# Patient Record
Sex: Male | Born: 1970 | Race: White | Hispanic: No | Marital: Married | Smoking: Former smoker
Health system: Southern US, Community
[De-identification: ages and names within clinical notes are randomized; demographics above are authoritative.]

## PROBLEM LIST (undated history)

## (undated) DIAGNOSIS — F32A Depression, unspecified: Secondary | ICD-10-CM

## (undated) DIAGNOSIS — I10 Essential (primary) hypertension: Secondary | ICD-10-CM

## (undated) DIAGNOSIS — F329 Major depressive disorder, single episode, unspecified: Secondary | ICD-10-CM

## (undated) DIAGNOSIS — F419 Anxiety disorder, unspecified: Secondary | ICD-10-CM

## (undated) DIAGNOSIS — M1990 Unspecified osteoarthritis, unspecified site: Secondary | ICD-10-CM

## (undated) DIAGNOSIS — E669 Obesity, unspecified: Secondary | ICD-10-CM

## (undated) DIAGNOSIS — N200 Calculus of kidney: Secondary | ICD-10-CM

## (undated) DIAGNOSIS — E7800 Pure hypercholesterolemia, unspecified: Secondary | ICD-10-CM

## (undated) DIAGNOSIS — R7989 Other specified abnormal findings of blood chemistry: Secondary | ICD-10-CM

## (undated) DIAGNOSIS — J45909 Unspecified asthma, uncomplicated: Secondary | ICD-10-CM

## (undated) DIAGNOSIS — Z6834 Body mass index (BMI) 34.0-34.9, adult: Secondary | ICD-10-CM

## (undated) DIAGNOSIS — G4733 Obstructive sleep apnea (adult) (pediatric): Secondary | ICD-10-CM

## (undated) DIAGNOSIS — G4730 Sleep apnea, unspecified: Secondary | ICD-10-CM

## (undated) HISTORY — PX: CHOLECYSTECTOMY: SHX55

## (undated) HISTORY — DX: Calculus of kidney: N20.0

## (undated) HISTORY — DX: Depression, unspecified: F32.A

## (undated) HISTORY — PX: TONSILLECTOMY: SUR1361

## (undated) HISTORY — DX: Unspecified asthma, uncomplicated: J45.909

## (undated) HISTORY — DX: Body mass index (BMI) 34.0-34.9, adult: Z68.34

## (undated) HISTORY — DX: Essential (primary) hypertension: I10

## (undated) HISTORY — DX: Obstructive sleep apnea (adult) (pediatric): G47.33

## (undated) HISTORY — DX: Pure hypercholesterolemia, unspecified: E78.00

## (undated) HISTORY — DX: Unspecified osteoarthritis, unspecified site: M19.90

## (undated) HISTORY — DX: Obesity, unspecified: E66.9

## (undated) HISTORY — DX: Sleep apnea, unspecified: G47.30

## (undated) HISTORY — DX: Anxiety disorder, unspecified: F41.9

## (undated) HISTORY — DX: Other specified abnormal findings of blood chemistry: R79.89

---
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
## 1999-08-22 ENCOUNTER — Emergency Department (HOSPITAL_COMMUNITY): Admission: EM | Admit: 1999-08-22 | Discharge: 1999-08-22 | Payer: Self-pay | Admitting: Emergency Medicine

## 2000-09-22 ENCOUNTER — Emergency Department (HOSPITAL_COMMUNITY): Admission: EM | Admit: 2000-09-22 | Discharge: 2000-09-22 | Payer: Self-pay | Admitting: Emergency Medicine

## 2007-01-17 HISTORY — PX: COLONOSCOPY: SHX174

## 2011-10-27 DIAGNOSIS — F312 Bipolar disorder, current episode manic severe with psychotic features: Secondary | ICD-10-CM | POA: Diagnosis not present

## 2011-11-22 DIAGNOSIS — F312 Bipolar disorder, current episode manic severe with psychotic features: Secondary | ICD-10-CM | POA: Diagnosis not present

## 2011-12-06 DIAGNOSIS — F39 Unspecified mood [affective] disorder: Secondary | ICD-10-CM | POA: Diagnosis not present

## 2011-12-06 DIAGNOSIS — F489 Nonpsychotic mental disorder, unspecified: Secondary | ICD-10-CM | POA: Diagnosis not present

## 2011-12-06 DIAGNOSIS — F411 Generalized anxiety disorder: Secondary | ICD-10-CM | POA: Diagnosis not present

## 2011-12-06 DIAGNOSIS — F429 Obsessive-compulsive disorder, unspecified: Secondary | ICD-10-CM | POA: Diagnosis not present

## 2012-01-29 DIAGNOSIS — F312 Bipolar disorder, current episode manic severe with psychotic features: Secondary | ICD-10-CM | POA: Diagnosis not present

## 2012-02-07 DIAGNOSIS — E559 Vitamin D deficiency, unspecified: Secondary | ICD-10-CM | POA: Diagnosis not present

## 2012-02-07 DIAGNOSIS — J45909 Unspecified asthma, uncomplicated: Secondary | ICD-10-CM | POA: Diagnosis not present

## 2012-02-07 DIAGNOSIS — F411 Generalized anxiety disorder: Secondary | ICD-10-CM | POA: Diagnosis not present

## 2012-02-07 DIAGNOSIS — J310 Chronic rhinitis: Secondary | ICD-10-CM | POA: Diagnosis not present

## 2012-02-07 DIAGNOSIS — G4730 Sleep apnea, unspecified: Secondary | ICD-10-CM | POA: Diagnosis not present

## 2012-02-14 DIAGNOSIS — G4710 Hypersomnia, unspecified: Secondary | ICD-10-CM | POA: Diagnosis not present

## 2012-02-14 DIAGNOSIS — G4730 Sleep apnea, unspecified: Secondary | ICD-10-CM | POA: Diagnosis not present

## 2012-02-27 DIAGNOSIS — G4730 Sleep apnea, unspecified: Secondary | ICD-10-CM | POA: Diagnosis not present

## 2012-02-27 DIAGNOSIS — R5383 Other fatigue: Secondary | ICD-10-CM | POA: Diagnosis not present

## 2012-02-27 DIAGNOSIS — G4710 Hypersomnia, unspecified: Secondary | ICD-10-CM | POA: Diagnosis not present

## 2012-02-27 DIAGNOSIS — J45909 Unspecified asthma, uncomplicated: Secondary | ICD-10-CM | POA: Diagnosis not present

## 2012-02-27 DIAGNOSIS — R5381 Other malaise: Secondary | ICD-10-CM | POA: Diagnosis not present

## 2012-02-27 DIAGNOSIS — J310 Chronic rhinitis: Secondary | ICD-10-CM | POA: Diagnosis not present

## 2012-03-12 DIAGNOSIS — G4710 Hypersomnia, unspecified: Secondary | ICD-10-CM | POA: Diagnosis not present

## 2012-03-12 DIAGNOSIS — G4730 Sleep apnea, unspecified: Secondary | ICD-10-CM | POA: Diagnosis not present

## 2012-03-18 DIAGNOSIS — F4010 Social phobia, unspecified: Secondary | ICD-10-CM | POA: Diagnosis not present

## 2012-03-18 DIAGNOSIS — G4710 Hypersomnia, unspecified: Secondary | ICD-10-CM | POA: Diagnosis not present

## 2012-03-18 DIAGNOSIS — F411 Generalized anxiety disorder: Secondary | ICD-10-CM | POA: Diagnosis not present

## 2012-03-18 DIAGNOSIS — J45909 Unspecified asthma, uncomplicated: Secondary | ICD-10-CM | POA: Diagnosis not present

## 2012-03-18 DIAGNOSIS — J310 Chronic rhinitis: Secondary | ICD-10-CM | POA: Diagnosis not present

## 2012-03-18 DIAGNOSIS — R5381 Other malaise: Secondary | ICD-10-CM | POA: Diagnosis not present

## 2012-03-18 DIAGNOSIS — R5383 Other fatigue: Secondary | ICD-10-CM | POA: Diagnosis not present

## 2012-03-18 DIAGNOSIS — G4730 Sleep apnea, unspecified: Secondary | ICD-10-CM | POA: Diagnosis not present

## 2012-04-03 DIAGNOSIS — F429 Obsessive-compulsive disorder, unspecified: Secondary | ICD-10-CM | POA: Diagnosis not present

## 2012-04-03 DIAGNOSIS — F4001 Agoraphobia with panic disorder: Secondary | ICD-10-CM | POA: Diagnosis not present

## 2012-04-03 DIAGNOSIS — F988 Other specified behavioral and emotional disorders with onset usually occurring in childhood and adolescence: Secondary | ICD-10-CM | POA: Diagnosis not present

## 2012-04-09 DIAGNOSIS — F312 Bipolar disorder, current episode manic severe with psychotic features: Secondary | ICD-10-CM | POA: Diagnosis not present

## 2012-04-15 DIAGNOSIS — Z125 Encounter for screening for malignant neoplasm of prostate: Secondary | ICD-10-CM | POA: Diagnosis not present

## 2012-04-15 DIAGNOSIS — I10 Essential (primary) hypertension: Secondary | ICD-10-CM | POA: Diagnosis not present

## 2012-04-15 DIAGNOSIS — E119 Type 2 diabetes mellitus without complications: Secondary | ICD-10-CM | POA: Diagnosis not present

## 2012-04-15 DIAGNOSIS — E559 Vitamin D deficiency, unspecified: Secondary | ICD-10-CM | POA: Diagnosis not present

## 2012-04-15 DIAGNOSIS — E782 Mixed hyperlipidemia: Secondary | ICD-10-CM | POA: Diagnosis not present

## 2012-04-23 DIAGNOSIS — S61409A Unspecified open wound of unspecified hand, initial encounter: Secondary | ICD-10-CM | POA: Diagnosis not present

## 2012-05-14 DIAGNOSIS — M545 Low back pain: Secondary | ICD-10-CM | POA: Diagnosis not present

## 2012-05-14 DIAGNOSIS — N400 Enlarged prostate without lower urinary tract symptoms: Secondary | ICD-10-CM | POA: Diagnosis not present

## 2012-05-14 DIAGNOSIS — J449 Chronic obstructive pulmonary disease, unspecified: Secondary | ICD-10-CM | POA: Diagnosis not present

## 2012-05-14 DIAGNOSIS — F411 Generalized anxiety disorder: Secondary | ICD-10-CM | POA: Diagnosis not present

## 2012-05-22 DIAGNOSIS — G4730 Sleep apnea, unspecified: Secondary | ICD-10-CM | POA: Diagnosis not present

## 2012-05-22 DIAGNOSIS — F411 Generalized anxiety disorder: Secondary | ICD-10-CM | POA: Diagnosis not present

## 2012-05-22 DIAGNOSIS — F4010 Social phobia, unspecified: Secondary | ICD-10-CM | POA: Diagnosis not present

## 2012-05-22 DIAGNOSIS — M545 Low back pain: Secondary | ICD-10-CM | POA: Diagnosis not present

## 2012-07-10 DIAGNOSIS — F988 Other specified behavioral and emotional disorders with onset usually occurring in childhood and adolescence: Secondary | ICD-10-CM | POA: Diagnosis not present

## 2012-07-10 DIAGNOSIS — F4001 Agoraphobia with panic disorder: Secondary | ICD-10-CM | POA: Diagnosis not present

## 2012-07-10 DIAGNOSIS — F429 Obsessive-compulsive disorder, unspecified: Secondary | ICD-10-CM | POA: Diagnosis not present

## 2012-07-16 DIAGNOSIS — F312 Bipolar disorder, current episode manic severe with psychotic features: Secondary | ICD-10-CM | POA: Diagnosis not present

## 2012-07-30 DIAGNOSIS — F312 Bipolar disorder, current episode manic severe with psychotic features: Secondary | ICD-10-CM | POA: Diagnosis not present

## 2012-08-13 DIAGNOSIS — Z23 Encounter for immunization: Secondary | ICD-10-CM | POA: Diagnosis not present

## 2012-08-13 DIAGNOSIS — J4489 Other specified chronic obstructive pulmonary disease: Secondary | ICD-10-CM | POA: Diagnosis not present

## 2012-08-13 DIAGNOSIS — G4730 Sleep apnea, unspecified: Secondary | ICD-10-CM | POA: Diagnosis not present

## 2012-08-13 DIAGNOSIS — F17200 Nicotine dependence, unspecified, uncomplicated: Secondary | ICD-10-CM | POA: Diagnosis not present

## 2012-08-13 DIAGNOSIS — J449 Chronic obstructive pulmonary disease, unspecified: Secondary | ICD-10-CM | POA: Diagnosis not present

## 2012-10-02 DIAGNOSIS — F429 Obsessive-compulsive disorder, unspecified: Secondary | ICD-10-CM | POA: Diagnosis not present

## 2012-10-02 DIAGNOSIS — F4001 Agoraphobia with panic disorder: Secondary | ICD-10-CM | POA: Diagnosis not present

## 2012-10-02 DIAGNOSIS — F988 Other specified behavioral and emotional disorders with onset usually occurring in childhood and adolescence: Secondary | ICD-10-CM | POA: Diagnosis not present

## 2012-11-07 DIAGNOSIS — M545 Low back pain: Secondary | ICD-10-CM | POA: Diagnosis not present

## 2012-11-07 DIAGNOSIS — F429 Obsessive-compulsive disorder, unspecified: Secondary | ICD-10-CM | POA: Diagnosis not present

## 2012-11-07 DIAGNOSIS — J309 Allergic rhinitis, unspecified: Secondary | ICD-10-CM | POA: Diagnosis not present

## 2012-11-20 DIAGNOSIS — F312 Bipolar disorder, current episode manic severe with psychotic features: Secondary | ICD-10-CM | POA: Diagnosis not present

## 2012-12-19 DIAGNOSIS — F312 Bipolar disorder, current episode manic severe with psychotic features: Secondary | ICD-10-CM | POA: Diagnosis not present

## 2013-01-01 DIAGNOSIS — F4001 Agoraphobia with panic disorder: Secondary | ICD-10-CM | POA: Diagnosis not present

## 2013-01-01 DIAGNOSIS — F429 Obsessive-compulsive disorder, unspecified: Secondary | ICD-10-CM | POA: Diagnosis not present

## 2013-01-01 DIAGNOSIS — F988 Other specified behavioral and emotional disorders with onset usually occurring in childhood and adolescence: Secondary | ICD-10-CM | POA: Diagnosis not present

## 2013-02-25 DIAGNOSIS — F312 Bipolar disorder, current episode manic severe with psychotic features: Secondary | ICD-10-CM | POA: Diagnosis not present

## 2013-04-21 DIAGNOSIS — F4001 Agoraphobia with panic disorder: Secondary | ICD-10-CM | POA: Diagnosis not present

## 2013-04-21 DIAGNOSIS — F988 Other specified behavioral and emotional disorders with onset usually occurring in childhood and adolescence: Secondary | ICD-10-CM | POA: Diagnosis not present

## 2013-04-21 DIAGNOSIS — F429 Obsessive-compulsive disorder, unspecified: Secondary | ICD-10-CM | POA: Diagnosis not present

## 2013-04-22 DIAGNOSIS — F312 Bipolar disorder, current episode manic severe with psychotic features: Secondary | ICD-10-CM | POA: Diagnosis not present

## 2013-05-29 DIAGNOSIS — F312 Bipolar disorder, current episode manic severe with psychotic features: Secondary | ICD-10-CM | POA: Diagnosis not present

## 2013-06-02 DIAGNOSIS — E559 Vitamin D deficiency, unspecified: Secondary | ICD-10-CM | POA: Diagnosis not present

## 2013-06-02 DIAGNOSIS — I10 Essential (primary) hypertension: Secondary | ICD-10-CM | POA: Diagnosis not present

## 2013-06-02 DIAGNOSIS — E782 Mixed hyperlipidemia: Secondary | ICD-10-CM | POA: Diagnosis not present

## 2013-06-02 DIAGNOSIS — E119 Type 2 diabetes mellitus without complications: Secondary | ICD-10-CM | POA: Diagnosis not present

## 2013-06-02 DIAGNOSIS — F429 Obsessive-compulsive disorder, unspecified: Secondary | ICD-10-CM | POA: Diagnosis not present

## 2013-06-02 DIAGNOSIS — E291 Testicular hypofunction: Secondary | ICD-10-CM | POA: Diagnosis not present

## 2013-06-02 DIAGNOSIS — N400 Enlarged prostate without lower urinary tract symptoms: Secondary | ICD-10-CM | POA: Diagnosis not present

## 2013-06-02 DIAGNOSIS — J449 Chronic obstructive pulmonary disease, unspecified: Secondary | ICD-10-CM | POA: Diagnosis not present

## 2013-06-02 DIAGNOSIS — E039 Hypothyroidism, unspecified: Secondary | ICD-10-CM | POA: Diagnosis not present

## 2013-07-08 DIAGNOSIS — E291 Testicular hypofunction: Secondary | ICD-10-CM | POA: Diagnosis not present

## 2013-07-10 DIAGNOSIS — F411 Generalized anxiety disorder: Secondary | ICD-10-CM | POA: Diagnosis not present

## 2013-07-10 DIAGNOSIS — J449 Chronic obstructive pulmonary disease, unspecified: Secondary | ICD-10-CM | POA: Diagnosis not present

## 2013-07-10 DIAGNOSIS — M25519 Pain in unspecified shoulder: Secondary | ICD-10-CM | POA: Diagnosis not present

## 2013-07-10 DIAGNOSIS — F429 Obsessive-compulsive disorder, unspecified: Secondary | ICD-10-CM | POA: Diagnosis not present

## 2013-07-21 DIAGNOSIS — F429 Obsessive-compulsive disorder, unspecified: Secondary | ICD-10-CM | POA: Diagnosis not present

## 2013-08-18 DIAGNOSIS — M5126 Other intervertebral disc displacement, lumbar region: Secondary | ICD-10-CM | POA: Diagnosis not present

## 2013-08-18 DIAGNOSIS — F411 Generalized anxiety disorder: Secondary | ICD-10-CM | POA: Diagnosis not present

## 2013-09-23 DIAGNOSIS — H04129 Dry eye syndrome of unspecified lacrimal gland: Secondary | ICD-10-CM | POA: Diagnosis not present

## 2013-09-24 DIAGNOSIS — E039 Hypothyroidism, unspecified: Secondary | ICD-10-CM | POA: Diagnosis not present

## 2013-09-24 DIAGNOSIS — M5126 Other intervertebral disc displacement, lumbar region: Secondary | ICD-10-CM | POA: Diagnosis not present

## 2013-09-24 DIAGNOSIS — E291 Testicular hypofunction: Secondary | ICD-10-CM | POA: Diagnosis not present

## 2013-09-24 DIAGNOSIS — J449 Chronic obstructive pulmonary disease, unspecified: Secondary | ICD-10-CM | POA: Diagnosis not present

## 2013-09-24 DIAGNOSIS — E782 Mixed hyperlipidemia: Secondary | ICD-10-CM | POA: Diagnosis not present

## 2013-09-24 DIAGNOSIS — I10 Essential (primary) hypertension: Secondary | ICD-10-CM | POA: Diagnosis not present

## 2013-09-24 DIAGNOSIS — E119 Type 2 diabetes mellitus without complications: Secondary | ICD-10-CM | POA: Diagnosis not present

## 2013-09-24 DIAGNOSIS — E559 Vitamin D deficiency, unspecified: Secondary | ICD-10-CM | POA: Diagnosis not present

## 2013-09-24 DIAGNOSIS — F411 Generalized anxiety disorder: Secondary | ICD-10-CM | POA: Diagnosis not present

## 2013-10-22 DIAGNOSIS — F429 Obsessive-compulsive disorder, unspecified: Secondary | ICD-10-CM | POA: Diagnosis not present

## 2013-12-23 DIAGNOSIS — F17200 Nicotine dependence, unspecified, uncomplicated: Secondary | ICD-10-CM | POA: Diagnosis not present

## 2013-12-23 DIAGNOSIS — E782 Mixed hyperlipidemia: Secondary | ICD-10-CM | POA: Diagnosis not present

## 2013-12-23 DIAGNOSIS — R110 Nausea: Secondary | ICD-10-CM | POA: Diagnosis not present

## 2013-12-23 DIAGNOSIS — I10 Essential (primary) hypertension: Secondary | ICD-10-CM | POA: Diagnosis not present

## 2013-12-23 DIAGNOSIS — I2510 Atherosclerotic heart disease of native coronary artery without angina pectoris: Secondary | ICD-10-CM | POA: Diagnosis not present

## 2013-12-23 DIAGNOSIS — E119 Type 2 diabetes mellitus without complications: Secondary | ICD-10-CM | POA: Diagnosis not present

## 2013-12-23 DIAGNOSIS — J449 Chronic obstructive pulmonary disease, unspecified: Secondary | ICD-10-CM | POA: Diagnosis not present

## 2013-12-23 DIAGNOSIS — E039 Hypothyroidism, unspecified: Secondary | ICD-10-CM | POA: Diagnosis not present

## 2013-12-23 DIAGNOSIS — M25819 Other specified joint disorders, unspecified shoulder: Secondary | ICD-10-CM | POA: Diagnosis not present

## 2014-02-10 DIAGNOSIS — F988 Other specified behavioral and emotional disorders with onset usually occurring in childhood and adolescence: Secondary | ICD-10-CM | POA: Diagnosis not present

## 2014-02-18 DIAGNOSIS — S91109A Unspecified open wound of unspecified toe(s) without damage to nail, initial encounter: Secondary | ICD-10-CM | POA: Diagnosis not present

## 2014-02-18 DIAGNOSIS — F17200 Nicotine dependence, unspecified, uncomplicated: Secondary | ICD-10-CM | POA: Diagnosis not present

## 2014-03-31 DIAGNOSIS — M5126 Other intervertebral disc displacement, lumbar region: Secondary | ICD-10-CM | POA: Diagnosis not present

## 2014-03-31 DIAGNOSIS — F17200 Nicotine dependence, unspecified, uncomplicated: Secondary | ICD-10-CM | POA: Diagnosis not present

## 2014-03-31 DIAGNOSIS — E291 Testicular hypofunction: Secondary | ICD-10-CM | POA: Diagnosis not present

## 2014-05-05 DIAGNOSIS — F988 Other specified behavioral and emotional disorders with onset usually occurring in childhood and adolescence: Secondary | ICD-10-CM | POA: Diagnosis not present

## 2014-05-05 DIAGNOSIS — F429 Obsessive-compulsive disorder, unspecified: Secondary | ICD-10-CM | POA: Diagnosis not present

## 2014-05-05 DIAGNOSIS — F410 Panic disorder [episodic paroxysmal anxiety] without agoraphobia: Secondary | ICD-10-CM | POA: Diagnosis not present

## 2014-07-14 DIAGNOSIS — R5383 Other fatigue: Secondary | ICD-10-CM | POA: Diagnosis not present

## 2014-07-14 DIAGNOSIS — M25512 Pain in left shoulder: Secondary | ICD-10-CM | POA: Diagnosis not present

## 2014-07-14 DIAGNOSIS — E780 Pure hypercholesterolemia: Secondary | ICD-10-CM | POA: Diagnosis not present

## 2014-07-14 DIAGNOSIS — E291 Testicular hypofunction: Secondary | ICD-10-CM | POA: Diagnosis not present

## 2014-07-14 DIAGNOSIS — M25511 Pain in right shoulder: Secondary | ICD-10-CM | POA: Diagnosis not present

## 2014-07-14 DIAGNOSIS — Z23 Encounter for immunization: Secondary | ICD-10-CM | POA: Diagnosis not present

## 2014-07-14 DIAGNOSIS — J449 Chronic obstructive pulmonary disease, unspecified: Secondary | ICD-10-CM | POA: Diagnosis not present

## 2014-07-14 DIAGNOSIS — G8929 Other chronic pain: Secondary | ICD-10-CM | POA: Diagnosis not present

## 2014-07-14 DIAGNOSIS — Z125 Encounter for screening for malignant neoplasm of prostate: Secondary | ICD-10-CM | POA: Diagnosis not present

## 2014-07-23 DIAGNOSIS — M7551 Bursitis of right shoulder: Secondary | ICD-10-CM | POA: Diagnosis not present

## 2014-08-04 DIAGNOSIS — F42 Obsessive-compulsive disorder: Secondary | ICD-10-CM | POA: Diagnosis not present

## 2014-11-25 DIAGNOSIS — F42 Obsessive-compulsive disorder: Secondary | ICD-10-CM | POA: Diagnosis not present

## 2014-12-03 DIAGNOSIS — J449 Chronic obstructive pulmonary disease, unspecified: Secondary | ICD-10-CM | POA: Diagnosis not present

## 2014-12-03 DIAGNOSIS — E291 Testicular hypofunction: Secondary | ICD-10-CM | POA: Diagnosis not present

## 2014-12-31 DIAGNOSIS — E780 Pure hypercholesterolemia: Secondary | ICD-10-CM | POA: Diagnosis not present

## 2014-12-31 DIAGNOSIS — Z79899 Other long term (current) drug therapy: Secondary | ICD-10-CM | POA: Diagnosis not present

## 2015-02-17 DIAGNOSIS — F42 Obsessive-compulsive disorder: Secondary | ICD-10-CM | POA: Diagnosis not present

## 2015-05-13 DIAGNOSIS — F42 Obsessive-compulsive disorder: Secondary | ICD-10-CM | POA: Diagnosis not present

## 2015-05-13 DIAGNOSIS — F410 Panic disorder [episodic paroxysmal anxiety] without agoraphobia: Secondary | ICD-10-CM | POA: Diagnosis not present

## 2015-06-22 DIAGNOSIS — Z23 Encounter for immunization: Secondary | ICD-10-CM | POA: Diagnosis not present

## 2015-11-08 DIAGNOSIS — F988 Other specified behavioral and emotional disorders with onset usually occurring in childhood and adolescence: Secondary | ICD-10-CM | POA: Diagnosis not present

## 2015-11-08 DIAGNOSIS — F410 Panic disorder [episodic paroxysmal anxiety] without agoraphobia: Secondary | ICD-10-CM | POA: Diagnosis not present

## 2015-11-08 DIAGNOSIS — F429 Obsessive-compulsive disorder, unspecified: Secondary | ICD-10-CM | POA: Diagnosis not present

## 2016-02-01 DIAGNOSIS — F410 Panic disorder [episodic paroxysmal anxiety] without agoraphobia: Secondary | ICD-10-CM | POA: Diagnosis not present

## 2016-02-01 DIAGNOSIS — F988 Other specified behavioral and emotional disorders with onset usually occurring in childhood and adolescence: Secondary | ICD-10-CM | POA: Diagnosis not present

## 2016-02-01 DIAGNOSIS — F429 Obsessive-compulsive disorder, unspecified: Secondary | ICD-10-CM | POA: Diagnosis not present

## 2016-02-10 DIAGNOSIS — E7800 Pure hypercholesterolemia, unspecified: Secondary | ICD-10-CM | POA: Diagnosis not present

## 2016-02-10 DIAGNOSIS — J449 Chronic obstructive pulmonary disease, unspecified: Secondary | ICD-10-CM | POA: Diagnosis not present

## 2016-02-10 DIAGNOSIS — R0681 Apnea, not elsewhere classified: Secondary | ICD-10-CM | POA: Diagnosis not present

## 2016-02-10 DIAGNOSIS — Z720 Tobacco use: Secondary | ICD-10-CM | POA: Diagnosis not present

## 2016-02-10 DIAGNOSIS — K219 Gastro-esophageal reflux disease without esophagitis: Secondary | ICD-10-CM | POA: Diagnosis not present

## 2016-02-10 DIAGNOSIS — F17200 Nicotine dependence, unspecified, uncomplicated: Secondary | ICD-10-CM | POA: Diagnosis not present

## 2016-02-10 DIAGNOSIS — E785 Hyperlipidemia, unspecified: Secondary | ICD-10-CM | POA: Diagnosis not present

## 2016-04-27 DIAGNOSIS — F429 Obsessive-compulsive disorder, unspecified: Secondary | ICD-10-CM | POA: Diagnosis not present

## 2016-04-27 DIAGNOSIS — F410 Panic disorder [episodic paroxysmal anxiety] without agoraphobia: Secondary | ICD-10-CM | POA: Diagnosis not present

## 2016-04-27 DIAGNOSIS — F988 Other specified behavioral and emotional disorders with onset usually occurring in childhood and adolescence: Secondary | ICD-10-CM | POA: Diagnosis not present

## 2016-07-20 DIAGNOSIS — F900 Attention-deficit hyperactivity disorder, predominantly inattentive type: Secondary | ICD-10-CM | POA: Diagnosis not present

## 2016-07-20 DIAGNOSIS — F429 Obsessive-compulsive disorder, unspecified: Secondary | ICD-10-CM | POA: Diagnosis not present

## 2016-07-20 DIAGNOSIS — F410 Panic disorder [episodic paroxysmal anxiety] without agoraphobia: Secondary | ICD-10-CM | POA: Diagnosis not present

## 2016-08-31 DIAGNOSIS — M67431 Ganglion, right wrist: Secondary | ICD-10-CM | POA: Diagnosis not present

## 2016-09-20 DIAGNOSIS — M67431 Ganglion, right wrist: Secondary | ICD-10-CM | POA: Diagnosis not present

## 2016-09-29 DIAGNOSIS — M67431 Ganglion, right wrist: Secondary | ICD-10-CM | POA: Diagnosis not present

## 2016-11-07 DIAGNOSIS — F410 Panic disorder [episodic paroxysmal anxiety] without agoraphobia: Secondary | ICD-10-CM | POA: Diagnosis not present

## 2016-11-07 DIAGNOSIS — F900 Attention-deficit hyperactivity disorder, predominantly inattentive type: Secondary | ICD-10-CM | POA: Diagnosis not present

## 2016-11-07 DIAGNOSIS — F429 Obsessive-compulsive disorder, unspecified: Secondary | ICD-10-CM | POA: Diagnosis not present

## 2017-02-01 DIAGNOSIS — F429 Obsessive-compulsive disorder, unspecified: Secondary | ICD-10-CM | POA: Diagnosis not present

## 2017-02-01 DIAGNOSIS — F900 Attention-deficit hyperactivity disorder, predominantly inattentive type: Secondary | ICD-10-CM | POA: Diagnosis not present

## 2017-02-01 DIAGNOSIS — F410 Panic disorder [episodic paroxysmal anxiety] without agoraphobia: Secondary | ICD-10-CM | POA: Diagnosis not present

## 2017-04-09 DIAGNOSIS — E785 Hyperlipidemia, unspecified: Secondary | ICD-10-CM | POA: Diagnosis not present

## 2017-04-09 DIAGNOSIS — J449 Chronic obstructive pulmonary disease, unspecified: Secondary | ICD-10-CM | POA: Diagnosis not present

## 2017-04-09 DIAGNOSIS — Z0000 Encounter for general adult medical examination without abnormal findings: Secondary | ICD-10-CM | POA: Diagnosis not present

## 2017-04-09 DIAGNOSIS — Z79899 Other long term (current) drug therapy: Secondary | ICD-10-CM | POA: Diagnosis not present

## 2017-04-09 DIAGNOSIS — M1990 Unspecified osteoarthritis, unspecified site: Secondary | ICD-10-CM | POA: Diagnosis not present

## 2017-04-09 DIAGNOSIS — N401 Enlarged prostate with lower urinary tract symptoms: Secondary | ICD-10-CM | POA: Diagnosis not present

## 2017-05-02 DIAGNOSIS — F429 Obsessive-compulsive disorder, unspecified: Secondary | ICD-10-CM | POA: Diagnosis not present

## 2017-05-02 DIAGNOSIS — F900 Attention-deficit hyperactivity disorder, predominantly inattentive type: Secondary | ICD-10-CM | POA: Diagnosis not present

## 2017-05-02 DIAGNOSIS — F410 Panic disorder [episodic paroxysmal anxiety] without agoraphobia: Secondary | ICD-10-CM | POA: Diagnosis not present

## 2017-08-06 DIAGNOSIS — F410 Panic disorder [episodic paroxysmal anxiety] without agoraphobia: Secondary | ICD-10-CM | POA: Diagnosis not present

## 2017-08-06 DIAGNOSIS — F900 Attention-deficit hyperactivity disorder, predominantly inattentive type: Secondary | ICD-10-CM | POA: Diagnosis not present

## 2017-08-06 DIAGNOSIS — F429 Obsessive-compulsive disorder, unspecified: Secondary | ICD-10-CM | POA: Diagnosis not present

## 2017-11-15 DIAGNOSIS — F429 Obsessive-compulsive disorder, unspecified: Secondary | ICD-10-CM | POA: Diagnosis not present

## 2017-11-15 DIAGNOSIS — F410 Panic disorder [episodic paroxysmal anxiety] without agoraphobia: Secondary | ICD-10-CM | POA: Diagnosis not present

## 2017-11-15 DIAGNOSIS — F900 Attention-deficit hyperactivity disorder, predominantly inattentive type: Secondary | ICD-10-CM | POA: Diagnosis not present

## 2018-02-12 DIAGNOSIS — F429 Obsessive-compulsive disorder, unspecified: Secondary | ICD-10-CM | POA: Diagnosis not present

## 2018-02-12 DIAGNOSIS — F410 Panic disorder [episodic paroxysmal anxiety] without agoraphobia: Secondary | ICD-10-CM | POA: Diagnosis not present

## 2018-02-12 DIAGNOSIS — F900 Attention-deficit hyperactivity disorder, predominantly inattentive type: Secondary | ICD-10-CM | POA: Diagnosis not present

## 2018-03-08 DIAGNOSIS — L570 Actinic keratosis: Secondary | ICD-10-CM | POA: Diagnosis not present

## 2018-03-18 DIAGNOSIS — F4310 Post-traumatic stress disorder, unspecified: Secondary | ICD-10-CM | POA: Diagnosis not present

## 2018-03-18 DIAGNOSIS — E669 Obesity, unspecified: Secondary | ICD-10-CM | POA: Diagnosis not present

## 2018-03-18 DIAGNOSIS — R7989 Other specified abnormal findings of blood chemistry: Secondary | ICD-10-CM | POA: Diagnosis not present

## 2018-03-18 DIAGNOSIS — Z79899 Other long term (current) drug therapy: Secondary | ICD-10-CM | POA: Diagnosis not present

## 2018-03-18 DIAGNOSIS — Z9181 History of falling: Secondary | ICD-10-CM | POA: Diagnosis not present

## 2018-03-18 DIAGNOSIS — Z1331 Encounter for screening for depression: Secondary | ICD-10-CM | POA: Diagnosis not present

## 2018-03-19 DIAGNOSIS — M25571 Pain in right ankle and joints of right foot: Secondary | ICD-10-CM | POA: Diagnosis not present

## 2018-03-19 DIAGNOSIS — S93491A Sprain of other ligament of right ankle, initial encounter: Secondary | ICD-10-CM | POA: Diagnosis not present

## 2018-03-25 DIAGNOSIS — M25571 Pain in right ankle and joints of right foot: Secondary | ICD-10-CM | POA: Diagnosis not present

## 2018-03-25 DIAGNOSIS — E669 Obesity, unspecified: Secondary | ICD-10-CM | POA: Diagnosis not present

## 2018-03-27 DIAGNOSIS — L03119 Cellulitis of unspecified part of limb: Secondary | ICD-10-CM | POA: Diagnosis not present

## 2018-03-27 DIAGNOSIS — S8491XA Injury of unspecified nerve at lower leg level, right leg, initial encounter: Secondary | ICD-10-CM | POA: Diagnosis not present

## 2018-03-27 DIAGNOSIS — S93491A Sprain of other ligament of right ankle, initial encounter: Secondary | ICD-10-CM | POA: Diagnosis not present

## 2018-05-01 DIAGNOSIS — S93491A Sprain of other ligament of right ankle, initial encounter: Secondary | ICD-10-CM | POA: Diagnosis not present

## 2018-05-14 DIAGNOSIS — F410 Panic disorder [episodic paroxysmal anxiety] without agoraphobia: Secondary | ICD-10-CM | POA: Diagnosis not present

## 2018-05-14 DIAGNOSIS — F429 Obsessive-compulsive disorder, unspecified: Secondary | ICD-10-CM | POA: Diagnosis not present

## 2018-05-14 DIAGNOSIS — F900 Attention-deficit hyperactivity disorder, predominantly inattentive type: Secondary | ICD-10-CM | POA: Diagnosis not present

## 2018-05-16 DIAGNOSIS — M4726 Other spondylosis with radiculopathy, lumbar region: Secondary | ICD-10-CM | POA: Diagnosis not present

## 2018-05-16 DIAGNOSIS — M5432 Sciatica, left side: Secondary | ICD-10-CM | POA: Diagnosis not present

## 2018-05-16 DIAGNOSIS — M5137 Other intervertebral disc degeneration, lumbosacral region: Secondary | ICD-10-CM | POA: Diagnosis not present

## 2018-05-16 DIAGNOSIS — Z6836 Body mass index (BMI) 36.0-36.9, adult: Secondary | ICD-10-CM | POA: Diagnosis not present

## 2018-05-16 DIAGNOSIS — M5440 Lumbago with sciatica, unspecified side: Secondary | ICD-10-CM | POA: Diagnosis not present

## 2018-05-23 DIAGNOSIS — M6281 Muscle weakness (generalized): Secondary | ICD-10-CM | POA: Diagnosis not present

## 2018-05-23 DIAGNOSIS — R2689 Other abnormalities of gait and mobility: Secondary | ICD-10-CM | POA: Diagnosis not present

## 2018-05-23 DIAGNOSIS — R293 Abnormal posture: Secondary | ICD-10-CM | POA: Diagnosis not present

## 2018-05-23 DIAGNOSIS — M4726 Other spondylosis with radiculopathy, lumbar region: Secondary | ICD-10-CM | POA: Diagnosis not present

## 2018-05-23 DIAGNOSIS — M2560 Stiffness of unspecified joint, not elsewhere classified: Secondary | ICD-10-CM | POA: Diagnosis not present

## 2018-05-28 DIAGNOSIS — M6281 Muscle weakness (generalized): Secondary | ICD-10-CM | POA: Diagnosis not present

## 2018-05-28 DIAGNOSIS — M4726 Other spondylosis with radiculopathy, lumbar region: Secondary | ICD-10-CM | POA: Diagnosis not present

## 2018-05-28 DIAGNOSIS — R293 Abnormal posture: Secondary | ICD-10-CM | POA: Diagnosis not present

## 2018-05-28 DIAGNOSIS — R2689 Other abnormalities of gait and mobility: Secondary | ICD-10-CM | POA: Diagnosis not present

## 2018-05-28 DIAGNOSIS — M2560 Stiffness of unspecified joint, not elsewhere classified: Secondary | ICD-10-CM | POA: Diagnosis not present

## 2018-06-03 DIAGNOSIS — M6281 Muscle weakness (generalized): Secondary | ICD-10-CM | POA: Diagnosis not present

## 2018-06-03 DIAGNOSIS — R293 Abnormal posture: Secondary | ICD-10-CM | POA: Diagnosis not present

## 2018-06-03 DIAGNOSIS — R2689 Other abnormalities of gait and mobility: Secondary | ICD-10-CM | POA: Diagnosis not present

## 2018-06-03 DIAGNOSIS — M2560 Stiffness of unspecified joint, not elsewhere classified: Secondary | ICD-10-CM | POA: Diagnosis not present

## 2018-06-03 DIAGNOSIS — M4726 Other spondylosis with radiculopathy, lumbar region: Secondary | ICD-10-CM | POA: Diagnosis not present

## 2018-06-05 DIAGNOSIS — M6281 Muscle weakness (generalized): Secondary | ICD-10-CM | POA: Diagnosis not present

## 2018-06-05 DIAGNOSIS — M4726 Other spondylosis with radiculopathy, lumbar region: Secondary | ICD-10-CM | POA: Diagnosis not present

## 2018-06-05 DIAGNOSIS — M2560 Stiffness of unspecified joint, not elsewhere classified: Secondary | ICD-10-CM | POA: Diagnosis not present

## 2018-06-05 DIAGNOSIS — R2689 Other abnormalities of gait and mobility: Secondary | ICD-10-CM | POA: Diagnosis not present

## 2018-06-05 DIAGNOSIS — R293 Abnormal posture: Secondary | ICD-10-CM | POA: Diagnosis not present

## 2018-06-12 DIAGNOSIS — M2560 Stiffness of unspecified joint, not elsewhere classified: Secondary | ICD-10-CM | POA: Diagnosis not present

## 2018-06-12 DIAGNOSIS — R2689 Other abnormalities of gait and mobility: Secondary | ICD-10-CM | POA: Diagnosis not present

## 2018-06-12 DIAGNOSIS — M4726 Other spondylosis with radiculopathy, lumbar region: Secondary | ICD-10-CM | POA: Diagnosis not present

## 2018-06-12 DIAGNOSIS — M6281 Muscle weakness (generalized): Secondary | ICD-10-CM | POA: Diagnosis not present

## 2018-06-12 DIAGNOSIS — R293 Abnormal posture: Secondary | ICD-10-CM | POA: Diagnosis not present

## 2018-06-14 DIAGNOSIS — M4726 Other spondylosis with radiculopathy, lumbar region: Secondary | ICD-10-CM | POA: Diagnosis not present

## 2018-06-14 DIAGNOSIS — R293 Abnormal posture: Secondary | ICD-10-CM | POA: Diagnosis not present

## 2018-06-14 DIAGNOSIS — R2689 Other abnormalities of gait and mobility: Secondary | ICD-10-CM | POA: Diagnosis not present

## 2018-06-14 DIAGNOSIS — M6281 Muscle weakness (generalized): Secondary | ICD-10-CM | POA: Diagnosis not present

## 2018-06-14 DIAGNOSIS — M2560 Stiffness of unspecified joint, not elsewhere classified: Secondary | ICD-10-CM | POA: Diagnosis not present

## 2018-06-18 DIAGNOSIS — R7989 Other specified abnormal findings of blood chemistry: Secondary | ICD-10-CM | POA: Diagnosis not present

## 2018-06-18 DIAGNOSIS — J45909 Unspecified asthma, uncomplicated: Secondary | ICD-10-CM | POA: Diagnosis not present

## 2018-06-18 DIAGNOSIS — Z23 Encounter for immunization: Secondary | ICD-10-CM | POA: Diagnosis not present

## 2018-06-18 DIAGNOSIS — K219 Gastro-esophageal reflux disease without esophagitis: Secondary | ICD-10-CM | POA: Diagnosis not present

## 2018-06-18 DIAGNOSIS — M19011 Primary osteoarthritis, right shoulder: Secondary | ICD-10-CM | POA: Diagnosis not present

## 2018-06-25 DIAGNOSIS — M5432 Sciatica, left side: Secondary | ICD-10-CM | POA: Diagnosis not present

## 2018-06-25 DIAGNOSIS — M47816 Spondylosis without myelopathy or radiculopathy, lumbar region: Secondary | ICD-10-CM | POA: Diagnosis not present

## 2018-06-25 DIAGNOSIS — Z6826 Body mass index (BMI) 26.0-26.9, adult: Secondary | ICD-10-CM | POA: Diagnosis not present

## 2018-07-03 DIAGNOSIS — M4807 Spinal stenosis, lumbosacral region: Secondary | ICD-10-CM | POA: Diagnosis not present

## 2018-07-03 DIAGNOSIS — M47816 Spondylosis without myelopathy or radiculopathy, lumbar region: Secondary | ICD-10-CM | POA: Diagnosis not present

## 2018-07-03 DIAGNOSIS — M5127 Other intervertebral disc displacement, lumbosacral region: Secondary | ICD-10-CM | POA: Diagnosis not present

## 2018-07-12 DIAGNOSIS — M48061 Spinal stenosis, lumbar region without neurogenic claudication: Secondary | ICD-10-CM | POA: Diagnosis not present

## 2018-07-12 DIAGNOSIS — M47816 Spondylosis without myelopathy or radiculopathy, lumbar region: Secondary | ICD-10-CM | POA: Diagnosis not present

## 2018-07-24 DIAGNOSIS — M48061 Spinal stenosis, lumbar region without neurogenic claudication: Secondary | ICD-10-CM | POA: Diagnosis not present

## 2018-08-07 DIAGNOSIS — M48061 Spinal stenosis, lumbar region without neurogenic claudication: Secondary | ICD-10-CM | POA: Diagnosis not present

## 2018-08-07 DIAGNOSIS — M5432 Sciatica, left side: Secondary | ICD-10-CM | POA: Diagnosis not present

## 2018-08-14 DIAGNOSIS — F410 Panic disorder [episodic paroxysmal anxiety] without agoraphobia: Secondary | ICD-10-CM | POA: Diagnosis not present

## 2018-08-14 DIAGNOSIS — F429 Obsessive-compulsive disorder, unspecified: Secondary | ICD-10-CM | POA: Diagnosis not present

## 2018-08-14 DIAGNOSIS — F900 Attention-deficit hyperactivity disorder, predominantly inattentive type: Secondary | ICD-10-CM | POA: Diagnosis not present

## 2018-09-30 DIAGNOSIS — I10 Essential (primary) hypertension: Secondary | ICD-10-CM | POA: Diagnosis not present

## 2018-09-30 DIAGNOSIS — Z87891 Personal history of nicotine dependence: Secondary | ICD-10-CM | POA: Diagnosis not present

## 2018-09-30 DIAGNOSIS — J45909 Unspecified asthma, uncomplicated: Secondary | ICD-10-CM | POA: Diagnosis not present

## 2018-09-30 DIAGNOSIS — K219 Gastro-esophageal reflux disease without esophagitis: Secondary | ICD-10-CM | POA: Diagnosis not present

## 2018-09-30 DIAGNOSIS — R7989 Other specified abnormal findings of blood chemistry: Secondary | ICD-10-CM | POA: Diagnosis not present

## 2018-12-06 DIAGNOSIS — F410 Panic disorder [episodic paroxysmal anxiety] without agoraphobia: Secondary | ICD-10-CM | POA: Diagnosis not present

## 2018-12-06 DIAGNOSIS — F900 Attention-deficit hyperactivity disorder, predominantly inattentive type: Secondary | ICD-10-CM | POA: Diagnosis not present

## 2018-12-06 DIAGNOSIS — F429 Obsessive-compulsive disorder, unspecified: Secondary | ICD-10-CM | POA: Diagnosis not present

## 2019-01-03 DIAGNOSIS — Z87891 Personal history of nicotine dependence: Secondary | ICD-10-CM | POA: Diagnosis not present

## 2019-01-03 DIAGNOSIS — K219 Gastro-esophageal reflux disease without esophagitis: Secondary | ICD-10-CM | POA: Diagnosis not present

## 2019-01-03 DIAGNOSIS — J45909 Unspecified asthma, uncomplicated: Secondary | ICD-10-CM | POA: Diagnosis not present

## 2019-01-03 DIAGNOSIS — R7989 Other specified abnormal findings of blood chemistry: Secondary | ICD-10-CM | POA: Diagnosis not present

## 2019-01-03 DIAGNOSIS — I10 Essential (primary) hypertension: Secondary | ICD-10-CM | POA: Diagnosis not present

## 2019-02-24 DIAGNOSIS — M48061 Spinal stenosis, lumbar region without neurogenic claudication: Secondary | ICD-10-CM | POA: Diagnosis not present

## 2019-02-24 DIAGNOSIS — M5432 Sciatica, left side: Secondary | ICD-10-CM | POA: Diagnosis not present

## 2019-03-04 DIAGNOSIS — M48061 Spinal stenosis, lumbar region without neurogenic claudication: Secondary | ICD-10-CM | POA: Diagnosis not present

## 2019-03-05 DIAGNOSIS — R7989 Other specified abnormal findings of blood chemistry: Secondary | ICD-10-CM | POA: Diagnosis not present

## 2019-03-05 DIAGNOSIS — J45909 Unspecified asthma, uncomplicated: Secondary | ICD-10-CM | POA: Diagnosis not present

## 2019-03-05 DIAGNOSIS — K219 Gastro-esophageal reflux disease without esophagitis: Secondary | ICD-10-CM | POA: Diagnosis not present

## 2019-03-05 DIAGNOSIS — Z87891 Personal history of nicotine dependence: Secondary | ICD-10-CM | POA: Diagnosis not present

## 2019-03-05 DIAGNOSIS — I10 Essential (primary) hypertension: Secondary | ICD-10-CM | POA: Diagnosis not present

## 2019-03-07 DIAGNOSIS — F429 Obsessive-compulsive disorder, unspecified: Secondary | ICD-10-CM | POA: Diagnosis not present

## 2019-03-07 DIAGNOSIS — F410 Panic disorder [episodic paroxysmal anxiety] without agoraphobia: Secondary | ICD-10-CM | POA: Diagnosis not present

## 2019-03-07 DIAGNOSIS — F900 Attention-deficit hyperactivity disorder, predominantly inattentive type: Secondary | ICD-10-CM | POA: Diagnosis not present

## 2019-04-01 DIAGNOSIS — M5432 Sciatica, left side: Secondary | ICD-10-CM | POA: Diagnosis not present

## 2019-04-01 DIAGNOSIS — M48061 Spinal stenosis, lumbar region without neurogenic claudication: Secondary | ICD-10-CM | POA: Diagnosis not present

## 2019-05-07 ENCOUNTER — Encounter: Payer: Self-pay | Admitting: Gastroenterology

## 2019-05-07 DIAGNOSIS — I10 Essential (primary) hypertension: Secondary | ICD-10-CM | POA: Diagnosis not present

## 2019-05-07 DIAGNOSIS — F411 Generalized anxiety disorder: Secondary | ICD-10-CM | POA: Diagnosis not present

## 2019-05-07 DIAGNOSIS — Z87891 Personal history of nicotine dependence: Secondary | ICD-10-CM | POA: Diagnosis not present

## 2019-05-07 DIAGNOSIS — R7989 Other specified abnormal findings of blood chemistry: Secondary | ICD-10-CM | POA: Diagnosis not present

## 2019-05-07 DIAGNOSIS — K219 Gastro-esophageal reflux disease without esophagitis: Secondary | ICD-10-CM | POA: Diagnosis not present

## 2019-05-07 DIAGNOSIS — Z1331 Encounter for screening for depression: Secondary | ICD-10-CM | POA: Diagnosis not present

## 2019-05-08 ENCOUNTER — Encounter: Payer: Self-pay | Admitting: Gastroenterology

## 2019-05-22 ENCOUNTER — Other Ambulatory Visit: Payer: Self-pay

## 2019-05-22 ENCOUNTER — Encounter: Payer: Self-pay | Admitting: Gastroenterology

## 2019-05-22 ENCOUNTER — Ambulatory Visit (INDEPENDENT_AMBULATORY_CARE_PROVIDER_SITE_OTHER): Payer: Medicare Other | Admitting: Gastroenterology

## 2019-05-22 ENCOUNTER — Other Ambulatory Visit (INDEPENDENT_AMBULATORY_CARE_PROVIDER_SITE_OTHER): Payer: Medicare Other

## 2019-05-22 ENCOUNTER — Ambulatory Visit: Payer: Self-pay | Admitting: Gastroenterology

## 2019-05-22 VITALS — BP 122/86 | HR 87 | Temp 98.5°F | Ht 69.0 in | Wt 270.1 lb

## 2019-05-22 DIAGNOSIS — R1030 Lower abdominal pain, unspecified: Secondary | ICD-10-CM

## 2019-05-22 DIAGNOSIS — K580 Irritable bowel syndrome with diarrhea: Secondary | ICD-10-CM | POA: Diagnosis not present

## 2019-05-22 DIAGNOSIS — K219 Gastro-esophageal reflux disease without esophagitis: Secondary | ICD-10-CM

## 2019-05-22 DIAGNOSIS — R140 Abdominal distension (gaseous): Secondary | ICD-10-CM | POA: Diagnosis not present

## 2019-05-22 LAB — COMPREHENSIVE METABOLIC PANEL
ALT: 20 U/L (ref 0–53)
AST: 16 U/L (ref 0–37)
Albumin: 4.2 g/dL (ref 3.5–5.2)
Alkaline Phosphatase: 144 U/L — ABNORMAL HIGH (ref 39–117)
BUN: 11 mg/dL (ref 6–23)
CO2: 25 mEq/L (ref 19–32)
Calcium: 9.4 mg/dL (ref 8.4–10.5)
Chloride: 104 mEq/L (ref 96–112)
Creatinine, Ser: 1.07 mg/dL (ref 0.40–1.50)
GFR: 73.86 mL/min (ref >60.00)
Glucose, Bld: 118 mg/dL — ABNORMAL HIGH (ref 70–99)
Potassium: 3.9 mEq/L (ref 3.5–5.1)
Sodium: 139 mEq/L (ref 135–145)
Total Bilirubin: 0.4 mg/dL (ref 0.2–1.2)
Total Protein: 7.4 g/dL (ref 6.0–8.3)

## 2019-05-22 LAB — CBC WITH DIFFERENTIAL/PLATELET
Basophils Absolute: 0.1 10*3/uL (ref 0.0–0.1)
Basophils Relative: 0.9 % (ref 0.0–3.0)
Eosinophils Absolute: 0.6 10*3/uL (ref 0.0–0.7)
Eosinophils Relative: 4.6 % (ref 0.0–5.0)
HCT: 48.7 % (ref 39.0–52.0)
Hemoglobin: 16.3 g/dL (ref 13.0–17.0)
Lymphocytes Relative: 29 % (ref 12.0–46.0)
Lymphs Abs: 3.8 10*3/uL (ref 0.7–4.0)
MCHC: 33.6 g/dL (ref 30.0–36.0)
MCV: 92.3 fl (ref 78.0–100.0)
Monocytes Absolute: 0.7 10*3/uL (ref 0.1–1.0)
Monocytes Relative: 5.6 % (ref 3.0–12.0)
Neutro Abs: 8 10*3/uL — ABNORMAL HIGH (ref 1.4–7.7)
Neutrophils Relative %: 59.9 % (ref 43.0–77.0)
Platelets: 318 10*3/uL (ref 150.0–400.0)
RBC: 5.27 Mil/uL (ref 4.22–5.81)
RDW: 12.3 % (ref 11.5–15.5)
WBC: 13.3 10*3/uL — ABNORMAL HIGH (ref 4.0–10.5)

## 2019-05-22 LAB — LIPASE: Lipase: 9 U/L — ABNORMAL LOW (ref 11.0–59.0)

## 2019-05-22 LAB — TSH: TSH: 2.84 u[IU]/mL (ref 0.35–4.50)

## 2019-05-22 NOTE — Progress Notes (Signed)
Chief Complaint: Abdominal pain  Referring Provider:  Jeanie Sewer, NP     ASSESSMENT AND PLAN;   #1.  Generalized Abdo pain (more so lower abdominal) with bloating, intermittent nausea/vomiting.  #2. IBS with diarrhea, with occ constiaption. H/O ? Rectal bleeding.  #3. GERD   Plan: - Check CBC, CMP, TSH, Celiac, lipase, CRP - CT AP with PO and IV contrast. R/O PSBO. - Continue omeprazole 20 mg p.o. twice daily. - Continue dicyclomine 10 mg p.o. twice daily PRN. - Therafter EGD and colon with 2 day prep at [facility].  I have discussed risks and benefits with the patient patient's wife in detail.  Patient's wife works in [employer] and is quite aware of the procedures.  All the questions were answered.   HPI:    Robert Erickson is a 48 y.o. male  With 48-month history of lower abdominal pain associated with abdominal bloating, getting worse.  He has steadily been gaining weight. Has been having alternating diarrhea and constipation. Describes abdominal pain as-sharp, nonradiating, increases after eating. Also had questionable black tarry stools  -attributed to rectal bleeding.  He does admit that at times he will have bright red blood as well. Would have heartburn, occasional nausea/vomiting.  Has been started on omeprazole 20 mg p.o. twice daily with some relief. Overall has a good appetite. Denies having any odynophagia or dysphagia.  SH: Disabled, married, wife works in [employer] Past GI procedures: Colonoscopy 01/2005 ([facility]) poor preparation, small colonic polyp, small diverticulum in TI, mild sigmoid diverticulosis. Bx-hyperplastic polyp.  Random colonic biopsies showed nonspecific chronic colitis. Past Medical History:  Diagnosis Date   Anxiety    Arthritis    Asthma    Depression    Elevated cholesterol    Hypertension    Kidney stone    Obesity    Sleep apnea    does have a cpap machine    Past Surgical History:  Procedure Laterality Date    CHOLECYSTECTOMY     COLONOSCOPY  01/17/2007   Diminuitive colonic polyp status post polypectomy. Minimal sigmoid diverticulosis. Incidental note of small diverticulum in the TI.   TONSILLECTOMY      Family History  Problem Relation Age of Onset   Pancreatic cancer Mother    Lung cancer Maternal Grandfather    Colon cancer Neg Hx    Esophageal cancer Neg Hx     Social History   Tobacco Use   Smoking status: Current Every Day Smoker   Smokeless tobacco: Never Used  Substance Use Topics   Alcohol use: Yes    Comment: occassionally    Drug use: Not Currently    Types: Marijuana    Current Outpatient Medications  Medication Sig Dispense Refill   albuterol (VENTOLIN HFA) 108 (90 Base) MCG/ACT inhaler 2 puffs 4 (four) times daily as needed.     amphetamine-dextroamphetamine (ADDERALL) 10 MG tablet 1 tablet daily.     azelastine (ASTELIN) 0.1 % nasal spray Place 1 spray into the nose daily.     budesonide-formoterol (SYMBICORT) 160-4.5 MCG/ACT inhaler Inhale 2 puffs into the lungs 2 (two) times daily.     citalopram (CELEXA) 20 MG tablet Take 20 mg by mouth daily.     clonazePAM (KLONOPIN) 0.5 MG tablet Take 0.5 mg by mouth as directed. Take 1 in the morning and 2 in the afternoon     dicyclomine (BENTYL) 10 MG capsule Take 10 mg by mouth 2 (two) times daily.     fluticasone (FLONASE)  50 MCG/ACT nasal spray Place 2 sprays into both nostrils daily.     loratadine (CLARITIN) 10 MG tablet Take 10 mg by mouth daily.     Multiple Vitamin (MULTIVITAMIN) tablet Take 1 tablet by mouth daily.     omeprazole (PRILOSEC) 20 MG capsule Take 20 mg by mouth 2 (two) times daily.     phenylephrine (SUDAFED PE) 10 MG TABS tablet Take 10 mg by mouth every 4 (four) hours as needed.     tamsulosin (FLOMAX) 0.4 MG CAPS capsule Take 0.4 mg by mouth daily.     TESTOSTERONE CYPIONATE IJ Inject 1 mL as directed as directed. Every other week     No current facility-administered  medications for this visit.     Allergies  Allergen Reactions   Shellfish Allergy Anaphylaxis   Aspirin Rash   Penicillin G Rash    Review of Systems:  Constitutional: Denies fever, chills, diaphoresis, appetite change and fatigue.  HEENT: Denies photophobia, eye pain, redness, hearing loss, ear pain, congestion, sore throat, rhinorrhea, sneezing, mouth sores, neck pain, neck stiffness and tinnitus.   Respiratory: Denies SOB, DOE, cough, chest tightness,  and wheezing.   Cardiovascular: Denies chest pain, palpitations and leg swelling.  Genitourinary: Denies dysuria, urgency, frequency, hematuria, flank pain and difficulty urinating.  Musculoskeletal: Has myalgias, back pain, joint swelling, arthralgias and gait problem.  Skin: No rash.  Neurological: Has tremors.  Hematological: Denies adenopathy. Easy bruising, personal or family bleeding history  Psychiatric/Behavioral: has anxiety or depression.  Has sleeping problems.     Physical Exam:    BP 122/86    Pulse 87    Temp 98.5 F (36.9 C)    Ht 5\' 9"  (1.753 m)    Wt 270 lb 2 oz (122.5 kg)    BMI 39.89 kg/m  Filed Weights   05/22/19 1347  Weight: 270 lb 2 oz (122.5 kg)   Constitutional:  Well-developed, in no acute distress. Psychiatric: Normal mood and affect. Behavior is normal. HEENT: Pupils normal.  Conjunctivae are normal. No scleral icterus. Neck supple.  Cardiovascular: Normal rate, regular rhythm. No edema Pulmonary/chest: Effort normal and breath sounds normal. No wheezing, rales or rhonchi. Abdominal: Soft, nondistended.  Diffuse abdominal tenderness. Bowel sounds active throughout. There are no masses palpable. No hepatomegaly. Rectal:  defered Neurological: Alert and oriented to person place and time. Skin: Skin is warm and dry. No rashes noted.  Data Reviewed: I have personally reviewed following labs and imaging studies    Carmell Austria, MD 05/22/2019, 2:10 PM  Cc: Jeanie Sewer, NP

## 2019-05-22 NOTE — Patient Instructions (Signed)
If you are age 48 or older, your body mass index should be between 23-30. Your Body mass index is 39.89 kg/m. If this is out of the aforementioned range listed, please consider follow up with your Primary Care Provider.  If you are age 46 or younger, your body mass index should be between 19-25. Your Body mass index is 39.89 kg/m. If this is out of the aformentioned range listed, please consider follow up with your Primary Care Provider.   To help prevent the possible spread of infection to our patients, communities, and staff; we will be implementing the following measures:  As of now we are not allowing any visitors/family members to accompany you to any upcoming appointments with [facility]. If you have any concerns about this please contact our office to discuss prior to the appointment.   Thank you,  Dr. Jackquline Denmark

## 2019-05-27 ENCOUNTER — Telehealth: Payer: Self-pay | Admitting: Gastroenterology

## 2019-05-27 LAB — CELIAC DISEASE PANEL
(tTG) Ab, IgA: 1 U/mL
(tTG) Ab, IgG: 2 U/mL
Gliadin IgA: 26 Units — ABNORMAL HIGH
Gliadin IgG: 3 Units
Immunoglobulin A: 342 mg/dL — ABNORMAL HIGH (ref 47–310)

## 2019-05-28 NOTE — Progress Notes (Signed)
Patient returned call to the office and left a message-RN then attempted to call patient back and was unable to reach patient-left message for patient to call back to the office

## 2019-05-28 NOTE — Telephone Encounter (Signed)
Please see additional information concerning this patient 

## 2019-05-29 ENCOUNTER — Other Ambulatory Visit: Payer: Self-pay

## 2019-05-29 DIAGNOSIS — K580 Irritable bowel syndrome with diarrhea: Secondary | ICD-10-CM

## 2019-05-29 DIAGNOSIS — K50919 Crohn's disease, unspecified, with unspecified complications: Secondary | ICD-10-CM

## 2019-05-29 DIAGNOSIS — K219 Gastro-esophageal reflux disease without esophagitis: Secondary | ICD-10-CM

## 2019-05-29 DIAGNOSIS — R140 Abdominal distension (gaseous): Secondary | ICD-10-CM

## 2019-05-29 DIAGNOSIS — R197 Diarrhea, unspecified: Secondary | ICD-10-CM

## 2019-05-29 DIAGNOSIS — R1030 Lower abdominal pain, unspecified: Secondary | ICD-10-CM

## 2019-05-29 DIAGNOSIS — D509 Iron deficiency anemia, unspecified: Secondary | ICD-10-CM

## 2019-06-02 ENCOUNTER — Telehealth: Payer: Self-pay | Admitting: Gastroenterology

## 2019-06-02 NOTE — Telephone Encounter (Signed)
Called and spoke with Clarise Cruz, patient's wife-DPR verified-Sara informed that patient is scheduled on 06/04/2019 at 10:00 am for a CT abd/pel with contrast; Clarise Cruz verbalized understanding of information and instructions-Sara was also given the number to [facility] radiology of [phone] for questions concerning the CT scan itself; Clarise Cruz was also advised to call back to the office should questions/concerns arise ;

## 2019-06-02 NOTE — Telephone Encounter (Signed)
Left message for patient to call back to the office;  

## 2019-06-02 NOTE — Telephone Encounter (Signed)
Pt's wife Clarise Cruz returned your call.

## 2019-06-04 ENCOUNTER — Ambulatory Visit (HOSPITAL_BASED_OUTPATIENT_CLINIC_OR_DEPARTMENT_OTHER)
Admission: RE | Admit: 2019-06-04 | Discharge: 2019-06-04 | Disposition: A | Discharge status: Home / Self Care | Payer: Medicare Other | Source: Ambulatory Visit | Attending: Gastroenterology | Admitting: Gastroenterology

## 2019-06-04 DIAGNOSIS — F410 Panic disorder [episodic paroxysmal anxiety] without agoraphobia: Secondary | ICD-10-CM | POA: Diagnosis not present

## 2019-06-04 DIAGNOSIS — F900 Attention-deficit hyperactivity disorder, predominantly inattentive type: Secondary | ICD-10-CM | POA: Diagnosis not present

## 2019-06-04 DIAGNOSIS — R1030 Lower abdominal pain, unspecified: Secondary | ICD-10-CM | POA: Diagnosis not present

## 2019-06-04 DIAGNOSIS — K580 Irritable bowel syndrome with diarrhea: Secondary | ICD-10-CM | POA: Diagnosis not present

## 2019-06-04 DIAGNOSIS — R140 Abdominal distension (gaseous): Secondary | ICD-10-CM | POA: Diagnosis not present

## 2019-06-04 DIAGNOSIS — D509 Iron deficiency anemia, unspecified: Secondary | ICD-10-CM | POA: Insufficient documentation

## 2019-06-04 DIAGNOSIS — R112 Nausea with vomiting, unspecified: Secondary | ICD-10-CM | POA: Diagnosis not present

## 2019-06-04 DIAGNOSIS — K219 Gastro-esophageal reflux disease without esophagitis: Secondary | ICD-10-CM | POA: Diagnosis not present

## 2019-06-04 DIAGNOSIS — F429 Obsessive-compulsive disorder, unspecified: Secondary | ICD-10-CM | POA: Diagnosis not present

## 2019-06-04 DIAGNOSIS — R1110 Vomiting, unspecified: Secondary | ICD-10-CM | POA: Diagnosis not present

## 2019-06-04 DIAGNOSIS — R197 Diarrhea, unspecified: Secondary | ICD-10-CM

## 2019-06-04 MED ORDER — IOHEXOL 300 MG/ML  SOLN
100.0000 mL | Freq: Once | INTRAMUSCULAR | Status: AC | PRN
  Administered 2019-06-04: 10:00:00 100 mL via INTRAVENOUS

## 2019-06-06 ENCOUNTER — Other Ambulatory Visit: Payer: Self-pay

## 2019-06-06 DIAGNOSIS — R109 Unspecified abdominal pain: Secondary | ICD-10-CM

## 2019-06-06 DIAGNOSIS — K50919 Crohn's disease, unspecified, with unspecified complications: Secondary | ICD-10-CM

## 2019-06-06 DIAGNOSIS — K219 Gastro-esophageal reflux disease without esophagitis: Secondary | ICD-10-CM

## 2019-06-06 NOTE — Progress Notes (Signed)
Left message on wife's VM per patient request-patient has been scheduled at [facility] for MRI abd on 06/14/2019 @9 :30 am; Will inform wife of appt date/time when calls back to the office;

## 2019-06-09 ENCOUNTER — Telehealth: Payer: Self-pay | Admitting: Gastroenterology

## 2019-06-13 ENCOUNTER — Other Ambulatory Visit (HOSPITAL_BASED_OUTPATIENT_CLINIC_OR_DEPARTMENT_OTHER): Payer: Self-pay | Admitting: Gastroenterology

## 2019-06-13 DIAGNOSIS — Z0189 Encounter for other specified special examinations: Secondary | ICD-10-CM

## 2019-06-13 DIAGNOSIS — Z1389 Encounter for screening for other disorder: Secondary | ICD-10-CM

## 2019-06-14 ENCOUNTER — Other Ambulatory Visit: Payer: Self-pay

## 2019-06-14 ENCOUNTER — Ambulatory Visit (HOSPITAL_BASED_OUTPATIENT_CLINIC_OR_DEPARTMENT_OTHER)
Admission: RE | Admit: 2019-06-14 | Discharge: 2019-06-14 | Disposition: A | Discharge status: Home / Self Care | Payer: Medicare Other | Source: Ambulatory Visit | Attending: Gastroenterology | Admitting: Gastroenterology

## 2019-06-14 DIAGNOSIS — R109 Unspecified abdominal pain: Secondary | ICD-10-CM | POA: Diagnosis not present

## 2019-06-14 DIAGNOSIS — K50919 Crohn's disease, unspecified, with unspecified complications: Secondary | ICD-10-CM | POA: Insufficient documentation

## 2019-06-14 DIAGNOSIS — Z0189 Encounter for other specified special examinations: Secondary | ICD-10-CM

## 2019-06-14 DIAGNOSIS — K219 Gastro-esophageal reflux disease without esophagitis: Secondary | ICD-10-CM | POA: Diagnosis not present

## 2019-06-14 DIAGNOSIS — Z1389 Encounter for screening for other disorder: Secondary | ICD-10-CM | POA: Insufficient documentation

## 2019-06-14 DIAGNOSIS — Z77018 Contact with and (suspected) exposure to other hazardous metals: Secondary | ICD-10-CM | POA: Diagnosis not present

## 2019-06-14 DIAGNOSIS — N289 Disorder of kidney and ureter, unspecified: Secondary | ICD-10-CM | POA: Diagnosis not present

## 2019-06-14 MED ORDER — GADOBUTROL 1 MMOL/ML IV SOLN
10.0000 mL | Freq: Once | INTRAVENOUS | Status: AC | PRN
  Administered 2019-06-14: 10 mL via INTRAVENOUS

## 2019-07-09 ENCOUNTER — Telehealth: Payer: Self-pay | Admitting: Gastroenterology

## 2019-07-09 DIAGNOSIS — Z23 Encounter for immunization: Secondary | ICD-10-CM | POA: Diagnosis not present

## 2019-07-09 DIAGNOSIS — K219 Gastro-esophageal reflux disease without esophagitis: Secondary | ICD-10-CM | POA: Diagnosis not present

## 2019-07-09 DIAGNOSIS — J45909 Unspecified asthma, uncomplicated: Secondary | ICD-10-CM | POA: Diagnosis not present

## 2019-07-09 DIAGNOSIS — I10 Essential (primary) hypertension: Secondary | ICD-10-CM | POA: Diagnosis not present

## 2019-07-09 DIAGNOSIS — R7989 Other specified abnormal findings of blood chemistry: Secondary | ICD-10-CM | POA: Diagnosis not present

## 2019-07-10 NOTE — Telephone Encounter (Signed)
[facility] is scheduling appt for this patient

## 2019-07-17 ENCOUNTER — Encounter: Payer: Self-pay | Admitting: Gastroenterology

## 2019-07-17 ENCOUNTER — Other Ambulatory Visit: Payer: Self-pay

## 2019-07-17 ENCOUNTER — Ambulatory Visit (INDEPENDENT_AMBULATORY_CARE_PROVIDER_SITE_OTHER): Payer: Medicare Other | Admitting: Gastroenterology

## 2019-07-17 VITALS — BP 118/80 | HR 89 | Temp 98.3°F | Ht 69.0 in | Wt 266.1 lb

## 2019-07-17 DIAGNOSIS — Z1159 Encounter for screening for other viral diseases: Secondary | ICD-10-CM

## 2019-07-17 DIAGNOSIS — R1084 Generalized abdominal pain: Secondary | ICD-10-CM

## 2019-07-17 DIAGNOSIS — K219 Gastro-esophageal reflux disease without esophagitis: Secondary | ICD-10-CM

## 2019-07-17 DIAGNOSIS — K582 Mixed irritable bowel syndrome: Secondary | ICD-10-CM

## 2019-07-17 MED ORDER — SUPREP BOWEL PREP KIT 17.5-3.13-1.6 GM/177ML PO SOLN: 1.0000 | ORAL | 0 refills | Status: DC

## 2019-07-17 NOTE — Progress Notes (Signed)
Chief Complaint: FU  Referring Provider:  Jeanie Sewer, NP     ASSESSMENT AND PLAN;   #1.  Lower Abdo pain with bloating, nausea/vomiting (resolved on gluten free diet). Neg CT AP, then MRI 05/2019. Nl CBC, CMP (except for alk phos), TSH, Celiac (except for IgA AGA), lipase, CRP 05/2019  #2. IBS with diarrhea, with occ constiaption. H/O ? Rectal bleeding.  #3. GERD   Plan: - Gluten free diet to continue.  It has helped him a lot.  I do not believe that he has celiac disease.  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 - Decrease omeprazole to 20 mg p.o. QD. - Continue dicyclomine 10 mg p.o. twice daily PRN. - EGD with SB bx (SB bx may be neg on gluten free diet) and colon with 2 day prep at [facility].  I have discussed risks and benefits with the patient patient's wife in detail.  Patient's wife works in [employer] and is quite aware of the procedures.  All the questions were answered. - Encouraged him to continue losing weight gradually.  He has lost 4lb since the last visit. - Check LFTs at the time of EGD/colonoscopy. - Reports of CT/MRI given to the patient and patient's wife.   HPI:    Robert Erickson is a 48 y.o. male  For follow-up visit. Feels a whole lot better on gluten-free diet. Abdominal pain, diarrhea and bloating is much better. When he does eat foods with gluten-there is return of diarrhea and abdominal pain. He has lost 4 pounds since the last visit. No further rectal bleeding.  Would like to proceed with EGD and colonoscopy.  No further heartburn.  Had CT Abdo/pelvis which showed questionable renal lesion.  This prompted MRI-which was negative for any acute abnormalities.  Radiology does recommend repeating MRI in 1 year.  Patient's wife is aware.  She will talk to [facility] if it is needed or not.  SH: Disabled, married, wife works in [employer] Past GI procedures: Colonoscopy 01/2005 ([facility]) poor preparation, small colonic polyp, small diverticulum  in TI, mild sigmoid diverticulosis. Bx-hyperplastic polyp.  Random colonic biopsies showed nonspecific chronic colitis.   Wt Readings from Last 3 Encounters:  07/17/19 266 lb 2 oz (120.7 kg)  05/22/19 270 lb 2 oz (122.5 kg)    Past Medical History:  Diagnosis Date   Anxiety    Arthritis    Asthma    Depression    Elevated cholesterol    Hypertension    Kidney stone    Obesity    Sleep apnea    does have a cpap machine    Past Surgical History:  Procedure Laterality Date   CHOLECYSTECTOMY     COLONOSCOPY  01/17/2007   Diminuitive colonic polyp status post polypectomy. Minimal sigmoid diverticulosis. Incidental note of small diverticulum in the TI.   TONSILLECTOMY      Family History  Problem Relation Age of Onset   Pancreatic cancer Mother    Lung cancer Maternal Grandfather    Colon cancer Neg Hx    Esophageal cancer Neg Hx     Social History   Tobacco Use   Smoking status: Current Every Day Smoker   Smokeless tobacco: Never Used  Substance Use Topics   Alcohol use: Yes    Comment: occassionally    Drug use: Not Currently    Types: Marijuana    Current Outpatient Medications  Medication Sig Dispense Refill   albuterol (VENTOLIN HFA) 108 (90 Base) MCG/ACT  inhaler 2 puffs 4 (four) times daily as needed.     amphetamine-dextroamphetamine (ADDERALL) 10 MG tablet 1 tablet daily.     azelastine (ASTELIN) 0.1 % nasal spray Place 1 spray into the nose daily.     budesonide-formoterol (SYMBICORT) 160-4.5 MCG/ACT inhaler Inhale 2 puffs into the lungs 2 (two) times daily.     citalopram (CELEXA) 20 MG tablet Take 20 mg by mouth daily.     clonazePAM (KLONOPIN) 0.5 MG tablet Take 0.5 mg by mouth as directed. Take 1 in the morning and 2 in the afternoon     diclofenac sodium (VOLTAREN) 1 % GEL Apply topically as needed.     dicyclomine (BENTYL) 10 MG capsule Take 10 mg by mouth 2 (two) times daily.     fluticasone (FLONASE) 50 MCG/ACT nasal  spray Place 2 sprays into both nostrils daily.     loratadine (CLARITIN) 10 MG tablet Take 10 mg by mouth daily.     Multiple Vitamin (MULTIVITAMIN) tablet Take 1 tablet by mouth daily.     omeprazole (PRILOSEC) 20 MG capsule Take 20 mg by mouth 2 (two) times daily.     phenylephrine (SUDAFED PE) 10 MG TABS tablet Take 10 mg by mouth every 4 (four) hours as needed.     tamsulosin (FLOMAX) 0.4 MG CAPS capsule Take 0.4 mg by mouth daily.     TESTOSTERONE CYPIONATE IJ Inject 1 mL as directed as directed. Every other week     No current facility-administered medications for this visit.     Allergies  Allergen Reactions   Shellfish Allergy Anaphylaxis   Aspirin Rash   Penicillin G Rash    Review of Systems:  neg     Physical Exam:    BP 118/80    Pulse 89    Temp 98.3 F (36.8 C)    Ht 5' 9"  (1.753 m)    Wt 266 lb 2 oz (120.7 kg)    BMI 39.30 kg/m  Filed Weights   07/17/19 1608  Weight: 266 lb 2 oz (120.7 kg)   Constitutional:  Well-developed, in no acute distress. Psychiatric: Normal mood and affect. Behavior is normal. HEENT: Pupils normal.  Conjunctivae are normal. No scleral icterus. Neck supple.  Cardiovascular: Normal rate, regular rhythm. No edema Pulmonary/chest: Effort normal and breath sounds normal. No wheezing, rales or rhonchi. Abdominal: Soft, nondistended.  Diffuse abdominal tenderness. Bowel sounds active throughout. There are no masses palpable. No hepatomegaly. Rectal:  defered Neurological: Alert and oriented to person place and time. Skin: Skin is warm and dry. No rashes noted.  Data Reviewed: I have personally reviewed following labs and imaging studies Copy of the CT, MRI, labs given to the patient and patient's wife. 25 minutes spent with the patient today. Greater than 50% was spent in counseling and coordination of care with the patient    Carmell Austria, MD 07/17/2019, 4:18 PM  Cc: Jeanie Sewer, NP

## 2019-07-17 NOTE — Patient Instructions (Signed)
If you are age 48 or older, your body mass index should be between 23-30. Your Body mass index is 39.3 kg/m. If this is out of the aforementioned range listed, please consider follow up with your Primary Care Provider.  If you are age 65 or younger, your body mass index should be between 19-25. Your Body mass index is 39.3 kg/m. If this is out of the aformentioned range listed, please consider follow up with your Primary Care Provider.   Continue Gluten Free Diet.   Continue Omeprazole and Bentyl   You have been scheduled for an endoscopy and colonoscopy. Please follow the written instructions given to you at your visit today. Please pick up your prep supplies at the pharmacy within the next 1-3 days. If you use inhalers (even only as needed), please bring them with you on the day of your procedure. Your physician has requested that you go to www.startemmi.com and enter the access code given to you at your visit today. This web site gives a general overview about your procedure. However, you should still follow specific instructions given to you by our office regarding your preparation for the procedure.  Due to recent COVID-19 restrictions implemented by [employer] and state authorities and in an effort to keep both patients and staff as safe as possible, [facility] requires COVID-19 testing prior to any scheduled endoscopic procedure. The testing center is located at [address] [place], [place] [address] in the [facility] [employer]  suite.  Your appointment has been scheduled for 08/22/19 at 11:10am.   Please bring your insurance cards to this appointment. You will require your COVID screen 2 business days prior to your endoscopic procedure.  You are not required to quarantine after your screening.  You will only receive a phone call with the results if it is POSITIVE.  If you do not receive a call the day before your procedure you  should begin your prep, if ordered, and you should report to the endo center for your procedure at your designated appointment arrival time ( one hour prior to the procedure time). There is no cost to you for the screening on the day of the swab.  [facility] will file with your insurance company for the testing.    You may receive an automated phone call prior to your procedure or have a message in your MyChart that you have an appointment for a BP/15 at the [facility], please disregard this message.  Your testing will be at the [address] [place] location.   If you are leaving [facility] travel [place] on [address], turn left onto [address], turn night onto [address] at the 1st stop light turn right, pass the [employer] on your right and proceed to [place] (white building).    We have sent the following medications to your pharmacy for you to pick up at your convenience: Suprep  Thank you,  Dr. Jackquline Denmark

## 2019-08-22 ENCOUNTER — Ambulatory Visit (INDEPENDENT_AMBULATORY_CARE_PROVIDER_SITE_OTHER): Payer: Medicare Other

## 2019-08-22 ENCOUNTER — Other Ambulatory Visit: Payer: Self-pay | Admitting: Gastroenterology

## 2019-08-22 DIAGNOSIS — Z1159 Encounter for screening for other viral diseases: Secondary | ICD-10-CM

## 2019-08-22 LAB — SARS CORONAVIRUS 2 (TAT 6-24 HRS): SARS Coronavirus 2: NEGATIVE

## 2019-08-25 ENCOUNTER — Ambulatory Visit (AMBULATORY_SURGERY_CENTER): Payer: Medicare Other | Admitting: Gastroenterology

## 2019-08-25 ENCOUNTER — Other Ambulatory Visit: Payer: Self-pay

## 2019-08-25 ENCOUNTER — Encounter: Payer: Self-pay | Admitting: Gastroenterology

## 2019-08-25 VITALS — BP 108/54 | HR 72 | Temp 97.8°F | Resp 18 | Ht 69.0 in | Wt 266.0 lb

## 2019-08-25 DIAGNOSIS — K219 Gastro-esophageal reflux disease without esophagitis: Secondary | ICD-10-CM

## 2019-08-25 DIAGNOSIS — B3781 Candidal esophagitis: Secondary | ICD-10-CM

## 2019-08-25 DIAGNOSIS — K3189 Other diseases of stomach and duodenum: Secondary | ICD-10-CM

## 2019-08-25 DIAGNOSIS — K209 Esophagitis, unspecified without bleeding: Secondary | ICD-10-CM | POA: Diagnosis not present

## 2019-08-25 DIAGNOSIS — K5730 Diverticulosis of large intestine without perforation or abscess without bleeding: Secondary | ICD-10-CM

## 2019-08-25 DIAGNOSIS — K2950 Unspecified chronic gastritis without bleeding: Secondary | ICD-10-CM

## 2019-08-25 DIAGNOSIS — D124 Benign neoplasm of descending colon: Secondary | ICD-10-CM

## 2019-08-25 DIAGNOSIS — D123 Benign neoplasm of transverse colon: Secondary | ICD-10-CM

## 2019-08-25 DIAGNOSIS — Z1211 Encounter for screening for malignant neoplasm of colon: Secondary | ICD-10-CM | POA: Diagnosis not present

## 2019-08-25 DIAGNOSIS — R197 Diarrhea, unspecified: Secondary | ICD-10-CM

## 2019-08-25 DIAGNOSIS — R1084 Generalized abdominal pain: Secondary | ICD-10-CM

## 2019-08-25 DIAGNOSIS — K582 Mixed irritable bowel syndrome: Secondary | ICD-10-CM

## 2019-08-25 DIAGNOSIS — R109 Unspecified abdominal pain: Secondary | ICD-10-CM | POA: Diagnosis not present

## 2019-08-25 MED ORDER — FLUCONAZOLE 100 MG PO TABS: 100.0000 mg | ORAL_TABLET | Freq: Every day | ORAL | 0 refills | Status: DC

## 2019-08-25 MED ORDER — SODIUM CHLORIDE 0.9 % IV SOLN: 500.0000 mL | Freq: Once | INTRAVENOUS | Status: DC

## 2019-08-25 NOTE — Op Note (Signed)
[facility] Patient Name: Robert Erickson Procedure Date: 08/25/2019 10:16 AM MRN: [id number] Endoscopist: Jackquline Denmark , MD Age: 48 Referring MD:  Date of Birth: 05/17/1971 Gender: Male Account #: [id number] Procedure:                Upper GI endoscopy Indications:              #1. H/O nausea/vomiting (resolved on gluten free                            diet). Neg CT AP, then MRI 05/2019.                           #2. IBS with diarrhea, with occ constiaption.                           #3. GERD Medicines:                Monitored Anesthesia Care Procedure:                Pre-Anesthesia Assessment:                           - Prior to the procedure, a History and Physical                            was performed, and patient medications and                            allergies were reviewed. The patient's tolerance of                            previous anesthesia was also reviewed. The risks                            and benefits of the procedure and the sedation                            options and risks were discussed with the patient.                            All questions were answered, and informed consent                            was obtained. Prior Anticoagulants: The patient has                            taken no previous anticoagulant or antiplatelet                            agents. ASA Grade Assessment: II - A patient with                            mild systemic disease. After reviewing the risks  and benefits, the patient was deemed in                            satisfactory condition to undergo the procedure.                           - Prior to the procedure, a History and Physical                            was performed, and patient medications and                            allergies were reviewed. The patient's tolerance of                            previous anesthesia was also reviewed. The risks        and benefits of the procedure and the sedation                            options and risks were discussed with the patient.                            All questions were answered, and informed consent                            was obtained. Prior Anticoagulants: The patient has                            taken no previous anticoagulant or antiplatelet                            agents. ASA Grade Assessment: II - A patient with                            mild systemic disease. After reviewing the risks                            and benefits, the patient was deemed in                            satisfactory condition to undergo the procedure.                           After obtaining informed consent, the endoscope was                            passed under direct vision. Throughout the                            procedure, the patient's blood pressure, pulse, and                            oxygen saturations were monitored continuously. The  Endoscope was introduced through the mouth, and                            advanced to the second part of duodenum. The upper                            GI endoscopy was accomplished without difficulty.                            The patient tolerated the procedure well. Scope In: Scope Out: Findings:                 Localized, white plaques were found in the middle                            and distal third of the esophagus. Biopsies were                            taken with a cold forceps for histology.                           The Z-line was regular and was found 38 cm from the                            incisors.                           Localized minimal inflammation characterized by                            erythema was found in the gastric antrum. Biopsies                            were taken with a cold forceps for histology.                           The examined duodenum was normal. Biopsies for                             histology were taken with a cold forceps for                            evaluation of celiac disease. Complications:            No immediate complications. Estimated Blood Loss:     Estimated blood loss: none. Impression:               -Mild Candida esophagitis.                           -Mild gastritis.                           -Otherwise normal EGD. Recommendation:           - Patient has a contact number available for  emergencies. The signs and symptoms of potential                            delayed complications were discussed with the                            patient. Return to normal activities tomorrow.                            Written discharge instructions were provided to the                            patient.                           - Resume previous diet.                           - Continue present medications.                           - Diflucan 200 mg p.o. x1 followed by 100 mg p.o.                            once a day x 14 days. No refills.                           - Await pathology results.                           - No aspirin, ibuprofen, naproxen, or other                            non-steroidal anti-inflammatory drugs for 5 days                            after biopsy.                           - Return to GI clinic in 12 weeks. Jackquline Denmark, MD 08/25/2019 10:57:28 AM This report has been signed electronically.

## 2019-08-25 NOTE — Patient Instructions (Signed)
YOU HAD AN ENDOSCOPIC PROCEDURE TODAY AT THE [facility]:   Refer to the procedure report that was given to you for any specific questions about what was found during the examination.  If the procedure report does not answer your questions, please call your gastroenterologist to clarify.  If you requested that your care partner not be given the details of your procedure findings, then the procedure report has been included in a sealed envelope for you to review at your convenience later.  YOU SHOULD EXPECT: Some feelings of bloating in the abdomen. Passage of more gas than usual.  Walking can help get rid of the air that was put into your GI tract during the procedure and reduce the bloating. If you had a lower endoscopy (such as a colonoscopy or flexible sigmoidoscopy) you may notice spotting of blood in your stool or on the toilet paper. If you underwent a bowel prep for your procedure, you may not have a normal bowel movement for a few days.  Please Note:  You might notice some irritation and congestion in your nose or some drainage.  This is from the oxygen used during your procedure.  There is no need for concern and it should clear up in a day or so.  SYMPTOMS TO REPORT IMMEDIATELY:   Following lower endoscopy (colonoscopy or flexible sigmoidoscopy):  Excessive amounts of blood in the stool  Significant tenderness or worsening of abdominal pains  Swelling of the abdomen that is new, acute  Fever of 100F or higher   Following upper endoscopy (EGD)  Vomiting of blood or coffee ground material  New chest pain or pain under the shoulder blades  Painful or persistently difficult swallowing  New shortness of breath  Fever of 100F or higher  Black, tarry-looking stools  For urgent or emergent issues, a gastroenterologist can be reached at any hour by calling [phone].   DIET:  We do recommend a small meal at first, but then you may proceed to your regular diet.  Drink  plenty of fluids but you should avoid alcoholic beverages for 24 hours.  ACTIVITY:  You should plan to take it easy for the rest of today and you should NOT DRIVE or use heavy machinery until tomorrow (because of the sedation medicines used during the test).    FOLLOW UP: Our staff will call the number listed on your records 48-72 hours following your procedure to check on you and address any questions or concerns that you may have regarding the information given to you following your procedure. If we do not reach you, we will leave a message.  We will attempt to reach you two times.  During this call, we will ask if you have developed any symptoms of COVID 19. If you develop any symptoms (ie: fever, flu-like symptoms, shortness of breath, cough etc.) before then, please call [phone].  If you test positive for Covid 19 in the 2 weeks post procedure, please call and report this information to us.    If any biopsies were taken you will be contacted by phone or by letter within the next 1-3 weeks.  Please call us at [phone] if you have not heard about the biopsies in 3 weeks.    SIGNATURES/CONFIDENTIALITY: You and/or your care partner have signed paperwork which will be entered into your electronic medical record.  These signatures attest to the fact that that the information above on your After Visit Summary has been reviewed and is   understood.  Full responsibility of the confidentiality of this discharge information lies with you and/or your care-partner.    Handouts were given to your care partner on gastritis, polyps, diverticulosis, and hemorrhoids. NO ASPIRIN, ASPIRIN CONTAINING PRODUCTS (BC OR GOODY POWDERS) OR NSAIDS (IBUPROFEN, ADVIL, ALEVE, AND MOTRIN) for 5 days after biopsies ; TYLENOL IS OK TO TAKE if needed. Prescription was sent to [facility] for DIFLUCAN to be picked up. You may resume your current medications today. The office will call you with an appointment to  be seen in the office with Dr. Lyndel Safe for 12 weeks. Await biopsy results. Please call if any questions or concerns.

## 2019-08-25 NOTE — Progress Notes (Signed)
Pt was extremely anxious and jittery.  Had a difficult time taking b/p d/t pt's movement.  I pt t asked for his wife, so I brought her back in the recovery room and Dr. Lyndel Safe went over the the procedures's findings and I went over the discharge instructions.  No problems noted in the recovery room noted.

## 2019-08-25 NOTE — Op Note (Signed)
[facility] Patient Name: Lo Roston Procedure Date: 08/25/2019 10:16 AM MRN: [id number] Endoscopist: Jackquline Denmark , MD Age: 48 Referring MD:  Date of Birth: 1971-05-15 Gender: Male Account #: [id number] Procedure:                Colonoscopy Indications:              #1. Lower Abdo pain with bloating, nausea/vomiting                            (resolved on gluten free diet). Neg CT AP, then MRI                            05/2019.                           #2. IBS with diarrhea, with occ constiaption.                           #3. GERD Medicines:                Monitored Anesthesia Care Procedure:                Pre-Anesthesia Assessment:                           - Prior to the procedure, a History and Physical                            was performed, and patient medications and                            allergies were reviewed. The patient's tolerance of                            previous anesthesia was also reviewed. The risks                            and benefits of the procedure and the sedation                            options and risks were discussed with the patient.                            All questions were answered, and informed consent                            was obtained. Prior Anticoagulants: The patient has                            taken no previous anticoagulant or antiplatelet                            agents. ASA Grade Assessment: II - A patient with  mild systemic disease. After reviewing the risks                            and benefits, the patient was deemed in                            satisfactory condition to undergo the procedure.                           After obtaining informed consent, the colonoscope                            was passed under direct vision. Throughout the                            procedure, the patient's blood pressure, pulse, and                            oxygen saturations  were monitored continuously. The                            Colonoscope was introduced through the anus and                            advanced to the 4 cm into the ileum. The                            colonoscopy was performed without difficulty. The                            patient tolerated the procedure well. The quality                            of the bowel preparation was adequate to identify                            polyps. The terminal ileum, ileocecal valve,                            appendiceal orifice, and rectum were photographed. Scope In: 10:29:12 AM Scope Out: 10:43:58 AM Scope Withdrawal Time: 0 hours 12 minutes 51 seconds  Total Procedure Duration: 0 hours 14 minutes 46 seconds  Findings:                 The colon (entire examined portion) appeared                            normal. Biopsies for histology were taken with a                            cold forceps from the entire colon for evaluation                            of microscopic colitis.  Two sessile polyps were found in the distal                            descending colon and mid transverse colon. The                            polyps were 8 to 10 mm in size. Smaller polyp was                            removed using a cold snare and the larger polyp                            removed with a hot snare. Resection and retrieval                            were complete.                           A few small-mouthed diverticula were found in the                            sigmoid colon, descending colon and ascending colon.                           The terminal ileum appeared normal. Incidental                            small diverticula was noted in the terminal ileum.                            Biopsies were taken with a cold forceps for                            histology.                           The exam was otherwise without abnormality on                             direct and retroflexion views. Small internal                            hemorrhoids were noted on the retroflexed                            examination. Complications:            No immediate complications. Estimated Blood Loss:     Estimated blood loss: none. Impression:               -Colonic polyps s/p polypectomy.                           -Mild pancolonic diverticulosis.                           -  Otherwise normal colonoscopy to TI. Recommendation:           - Patient has a contact number available for                            emergencies. The signs and symptoms of potential                            delayed complications were discussed with the                            patient. Return to normal activities tomorrow.                            Written discharge instructions were provided to the                            patient.                           - Resume previous diet.                           - Continue present medications.                           - Await pathology results.                           - Repeat colonoscopy for surveillance based on                            pathology results.                           - Return to GI clinic in 12 weeks. Jackquline Denmark, MD 08/25/2019 10:51:25 AM This report has been signed electronically.

## 2019-08-25 NOTE — Progress Notes (Signed)
To PACU, VSS. Report to [email or web address] 

## 2019-08-25 NOTE — Progress Notes (Signed)
Called to room to assist during endoscopic procedure.  Patient ID and intended procedure confirmed with present staff. Received instructions for my participation in the procedure from the performing physician.  Zline 38 per Dr.Gupta

## 2019-08-27 ENCOUNTER — Telehealth: Payer: Self-pay

## 2019-08-27 DIAGNOSIS — F900 Attention-deficit hyperactivity disorder, predominantly inattentive type: Secondary | ICD-10-CM | POA: Diagnosis not present

## 2019-08-27 DIAGNOSIS — F410 Panic disorder [episodic paroxysmal anxiety] without agoraphobia: Secondary | ICD-10-CM | POA: Diagnosis not present

## 2019-08-27 DIAGNOSIS — F429 Obsessive-compulsive disorder, unspecified: Secondary | ICD-10-CM | POA: Diagnosis not present

## 2019-08-27 NOTE — Telephone Encounter (Signed)
  Follow up Call-  Call back number 08/25/2019  Post procedure Call Back phone  # [phone]  Permission to leave phone message Yes  Some recent data might be hidden     Patient questions:  Do you have a fever, pain , or abdominal swelling? No. Pain Score  0 *  Have you tolerated food without any problems? Yes.    Have you been able to return to your normal activities? Yes.    Do you have any questions about your discharge instructions: Diet   No. Medications  No. Follow up visit  No.  Do you have questions or concerns about your Care? No.  Actions: * If pain score is 4 or above: 1. No action needed, pain <4.Have you developed a fever since your procedure? no  2.   Have you had an respiratory symptoms (SOB or cough) since your procedure? no  3.   Have you tested positive for COVID 19 since your procedure no  4.   Have you had any family members/close contacts diagnosed with the COVID 19 since your procedure?  no   If yes to any of these questions please route to Joylene John, RN and Alphonsa Gin, Therapist, sports.

## 2019-09-04 ENCOUNTER — Encounter: Payer: Self-pay | Admitting: Gastroenterology

## 2019-09-26 ENCOUNTER — Telehealth: Payer: Self-pay

## 2019-09-26 NOTE — Telephone Encounter (Signed)
Left message for patient to call back to the office;  

## 2019-09-29 NOTE — Telephone Encounter (Signed)
Recall in Epic for patient to f/u 12 weeks post procedure

## 2019-10-09 DIAGNOSIS — I10 Essential (primary) hypertension: Secondary | ICD-10-CM | POA: Diagnosis not present

## 2019-10-09 DIAGNOSIS — R7989 Other specified abnormal findings of blood chemistry: Secondary | ICD-10-CM | POA: Diagnosis not present

## 2019-10-09 DIAGNOSIS — M159 Polyosteoarthritis, unspecified: Secondary | ICD-10-CM | POA: Diagnosis not present

## 2019-10-09 DIAGNOSIS — E782 Mixed hyperlipidemia: Secondary | ICD-10-CM | POA: Diagnosis not present

## 2019-11-24 DIAGNOSIS — F429 Obsessive-compulsive disorder, unspecified: Secondary | ICD-10-CM | POA: Diagnosis not present

## 2019-11-24 DIAGNOSIS — Z79899 Other long term (current) drug therapy: Secondary | ICD-10-CM | POA: Diagnosis not present

## 2019-11-24 DIAGNOSIS — F900 Attention-deficit hyperactivity disorder, predominantly inattentive type: Secondary | ICD-10-CM | POA: Diagnosis not present

## 2019-11-24 DIAGNOSIS — F410 Panic disorder [episodic paroxysmal anxiety] without agoraphobia: Secondary | ICD-10-CM | POA: Diagnosis not present

## 2020-01-07 DIAGNOSIS — I10 Essential (primary) hypertension: Secondary | ICD-10-CM | POA: Diagnosis not present

## 2020-01-07 DIAGNOSIS — R7989 Other specified abnormal findings of blood chemistry: Secondary | ICD-10-CM | POA: Diagnosis not present

## 2020-01-07 DIAGNOSIS — Z87891 Personal history of nicotine dependence: Secondary | ICD-10-CM | POA: Diagnosis not present

## 2020-01-07 DIAGNOSIS — M159 Polyosteoarthritis, unspecified: Secondary | ICD-10-CM | POA: Diagnosis not present

## 2020-01-07 DIAGNOSIS — E782 Mixed hyperlipidemia: Secondary | ICD-10-CM | POA: Diagnosis not present

## 2020-01-08 DIAGNOSIS — Z23 Encounter for immunization: Secondary | ICD-10-CM | POA: Diagnosis not present

## 2020-01-13 DIAGNOSIS — Z9181 History of falling: Secondary | ICD-10-CM | POA: Diagnosis not present

## 2020-01-13 DIAGNOSIS — Z6836 Body mass index (BMI) 36.0-36.9, adult: Secondary | ICD-10-CM | POA: Diagnosis not present

## 2020-01-13 DIAGNOSIS — E785 Hyperlipidemia, unspecified: Secondary | ICD-10-CM | POA: Diagnosis not present

## 2020-01-13 DIAGNOSIS — Z1331 Encounter for screening for depression: Secondary | ICD-10-CM | POA: Diagnosis not present

## 2020-01-13 DIAGNOSIS — Z0000 Encounter for general adult medical examination without abnormal findings: Secondary | ICD-10-CM | POA: Diagnosis not present

## 2020-02-05 DIAGNOSIS — Z23 Encounter for immunization: Secondary | ICD-10-CM | POA: Diagnosis not present

## 2020-02-17 DIAGNOSIS — F429 Obsessive-compulsive disorder, unspecified: Secondary | ICD-10-CM | POA: Diagnosis not present

## 2020-02-17 DIAGNOSIS — Z79899 Other long term (current) drug therapy: Secondary | ICD-10-CM | POA: Diagnosis not present

## 2020-02-17 DIAGNOSIS — F410 Panic disorder [episodic paroxysmal anxiety] without agoraphobia: Secondary | ICD-10-CM | POA: Diagnosis not present

## 2020-02-17 DIAGNOSIS — F900 Attention-deficit hyperactivity disorder, predominantly inattentive type: Secondary | ICD-10-CM | POA: Diagnosis not present

## 2020-04-07 DIAGNOSIS — M159 Polyosteoarthritis, unspecified: Secondary | ICD-10-CM | POA: Diagnosis not present

## 2020-04-07 DIAGNOSIS — R7989 Other specified abnormal findings of blood chemistry: Secondary | ICD-10-CM | POA: Diagnosis not present

## 2020-04-07 DIAGNOSIS — K219 Gastro-esophageal reflux disease without esophagitis: Secondary | ICD-10-CM | POA: Diagnosis not present

## 2020-04-07 DIAGNOSIS — Z6837 Body mass index (BMI) 37.0-37.9, adult: Secondary | ICD-10-CM | POA: Diagnosis not present

## 2020-04-07 DIAGNOSIS — E782 Mixed hyperlipidemia: Secondary | ICD-10-CM | POA: Diagnosis not present

## 2020-04-07 DIAGNOSIS — I10 Essential (primary) hypertension: Secondary | ICD-10-CM | POA: Diagnosis not present

## 2020-04-07 DIAGNOSIS — Z87891 Personal history of nicotine dependence: Secondary | ICD-10-CM | POA: Diagnosis not present

## 2020-04-07 DIAGNOSIS — F411 Generalized anxiety disorder: Secondary | ICD-10-CM | POA: Diagnosis not present

## 2020-05-12 DIAGNOSIS — F429 Obsessive-compulsive disorder, unspecified: Secondary | ICD-10-CM | POA: Diagnosis not present

## 2020-05-12 DIAGNOSIS — Z79899 Other long term (current) drug therapy: Secondary | ICD-10-CM | POA: Diagnosis not present

## 2020-05-12 DIAGNOSIS — F900 Attention-deficit hyperactivity disorder, predominantly inattentive type: Secondary | ICD-10-CM | POA: Diagnosis not present

## 2020-05-12 DIAGNOSIS — F4001 Agoraphobia with panic disorder: Secondary | ICD-10-CM | POA: Diagnosis not present

## 2020-05-12 DIAGNOSIS — F411 Generalized anxiety disorder: Secondary | ICD-10-CM | POA: Diagnosis not present

## 2020-07-13 DIAGNOSIS — Z6836 Body mass index (BMI) 36.0-36.9, adult: Secondary | ICD-10-CM | POA: Diagnosis not present

## 2020-07-13 DIAGNOSIS — Z23 Encounter for immunization: Secondary | ICD-10-CM | POA: Diagnosis not present

## 2020-07-13 DIAGNOSIS — R7989 Other specified abnormal findings of blood chemistry: Secondary | ICD-10-CM | POA: Diagnosis not present

## 2020-07-13 DIAGNOSIS — Z87891 Personal history of nicotine dependence: Secondary | ICD-10-CM | POA: Diagnosis not present

## 2020-07-13 DIAGNOSIS — E782 Mixed hyperlipidemia: Secondary | ICD-10-CM | POA: Diagnosis not present

## 2020-07-13 DIAGNOSIS — K219 Gastro-esophageal reflux disease without esophagitis: Secondary | ICD-10-CM | POA: Diagnosis not present

## 2020-07-13 DIAGNOSIS — M159 Polyosteoarthritis, unspecified: Secondary | ICD-10-CM | POA: Diagnosis not present

## 2020-07-13 DIAGNOSIS — F411 Generalized anxiety disorder: Secondary | ICD-10-CM | POA: Diagnosis not present

## 2020-07-13 DIAGNOSIS — I10 Essential (primary) hypertension: Secondary | ICD-10-CM | POA: Diagnosis not present

## 2020-07-20 DIAGNOSIS — E782 Mixed hyperlipidemia: Secondary | ICD-10-CM | POA: Diagnosis not present

## 2020-07-20 DIAGNOSIS — I10 Essential (primary) hypertension: Secondary | ICD-10-CM | POA: Diagnosis not present

## 2020-07-20 DIAGNOSIS — R7989 Other specified abnormal findings of blood chemistry: Secondary | ICD-10-CM | POA: Diagnosis not present

## 2020-08-02 DIAGNOSIS — Z79899 Other long term (current) drug therapy: Secondary | ICD-10-CM | POA: Diagnosis not present

## 2020-08-02 DIAGNOSIS — F411 Generalized anxiety disorder: Secondary | ICD-10-CM | POA: Diagnosis not present

## 2020-08-02 DIAGNOSIS — F900 Attention-deficit hyperactivity disorder, predominantly inattentive type: Secondary | ICD-10-CM | POA: Diagnosis not present

## 2020-08-02 DIAGNOSIS — F429 Obsessive-compulsive disorder, unspecified: Secondary | ICD-10-CM | POA: Diagnosis not present

## 2020-08-02 DIAGNOSIS — F410 Panic disorder [episodic paroxysmal anxiety] without agoraphobia: Secondary | ICD-10-CM | POA: Diagnosis not present

## 2020-08-10 DIAGNOSIS — Z23 Encounter for immunization: Secondary | ICD-10-CM | POA: Diagnosis not present

## 2020-10-13 DIAGNOSIS — I10 Essential (primary) hypertension: Secondary | ICD-10-CM | POA: Diagnosis not present

## 2020-10-13 DIAGNOSIS — R7989 Other specified abnormal findings of blood chemistry: Secondary | ICD-10-CM | POA: Diagnosis not present

## 2020-10-13 DIAGNOSIS — J449 Chronic obstructive pulmonary disease, unspecified: Secondary | ICD-10-CM | POA: Diagnosis not present

## 2020-10-13 DIAGNOSIS — E782 Mixed hyperlipidemia: Secondary | ICD-10-CM | POA: Diagnosis not present

## 2020-10-13 DIAGNOSIS — Z6836 Body mass index (BMI) 36.0-36.9, adult: Secondary | ICD-10-CM | POA: Diagnosis not present

## 2020-10-13 DIAGNOSIS — M159 Polyosteoarthritis, unspecified: Secondary | ICD-10-CM | POA: Diagnosis not present

## 2020-10-13 DIAGNOSIS — F411 Generalized anxiety disorder: Secondary | ICD-10-CM | POA: Diagnosis not present

## 2020-10-13 DIAGNOSIS — Z23 Encounter for immunization: Secondary | ICD-10-CM | POA: Diagnosis not present

## 2020-10-13 DIAGNOSIS — K219 Gastro-esophageal reflux disease without esophagitis: Secondary | ICD-10-CM | POA: Diagnosis not present

## 2021-01-11 DIAGNOSIS — M159 Polyosteoarthritis, unspecified: Secondary | ICD-10-CM | POA: Diagnosis not present

## 2021-01-11 DIAGNOSIS — I10 Essential (primary) hypertension: Secondary | ICD-10-CM | POA: Diagnosis not present

## 2021-01-11 DIAGNOSIS — E782 Mixed hyperlipidemia: Secondary | ICD-10-CM | POA: Diagnosis not present

## 2021-01-11 DIAGNOSIS — J449 Chronic obstructive pulmonary disease, unspecified: Secondary | ICD-10-CM | POA: Diagnosis not present

## 2021-01-11 DIAGNOSIS — K219 Gastro-esophageal reflux disease without esophagitis: Secondary | ICD-10-CM | POA: Diagnosis not present

## 2021-01-11 DIAGNOSIS — F411 Generalized anxiety disorder: Secondary | ICD-10-CM | POA: Diagnosis not present

## 2021-01-11 DIAGNOSIS — R7989 Other specified abnormal findings of blood chemistry: Secondary | ICD-10-CM | POA: Diagnosis not present

## 2021-01-12 DIAGNOSIS — F429 Obsessive-compulsive disorder, unspecified: Secondary | ICD-10-CM | POA: Diagnosis not present

## 2021-01-12 DIAGNOSIS — Z79899 Other long term (current) drug therapy: Secondary | ICD-10-CM | POA: Diagnosis not present

## 2021-01-12 DIAGNOSIS — F900 Attention-deficit hyperactivity disorder, predominantly inattentive type: Secondary | ICD-10-CM | POA: Diagnosis not present

## 2021-01-12 DIAGNOSIS — F411 Generalized anxiety disorder: Secondary | ICD-10-CM | POA: Diagnosis not present

## 2021-01-12 DIAGNOSIS — F4001 Agoraphobia with panic disorder: Secondary | ICD-10-CM | POA: Diagnosis not present

## 2021-01-24 IMAGING — CT CT ABD-PELV W/ CM
2 of 5 series · 16 of 46 positions shown, 18 images · IV contrast (APPLIED)
Comparison: None.

CLINICAL DATA: Partial small bowel obstruction, generalized
abdominal pain with bloating and intermittent nausea and vomiting.
Constipation.

EXAM:
CT ABDOMEN AND PELVIS WITH CONTRAST
TECHNIQUE: Multidetector CT imaging of the abdomen and pelvis was performed
using the standard protocol following bolus administration of
intravenous contrast.
CONTRAST:  100mL OMNIPAQUE IOHEXOL 300 MG/ML  SOLN

[Series 2: axial st · axial · 0.98mm/px · z∈[-588,-78]mm · 13 of 114 slices shown, 15 images]
[im 6/114  soft-tissue]
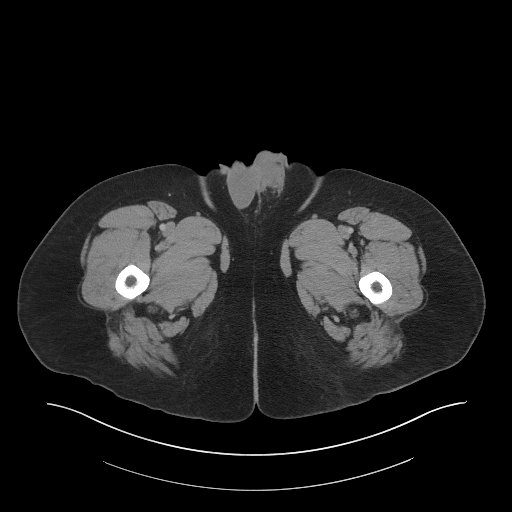
[im 6/114  bone]
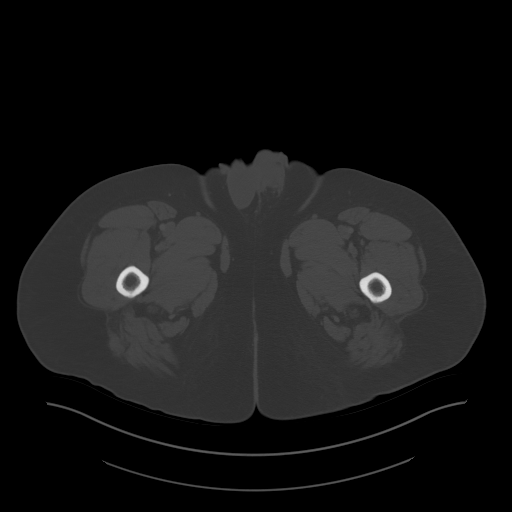
[im 18/114  soft-tissue]
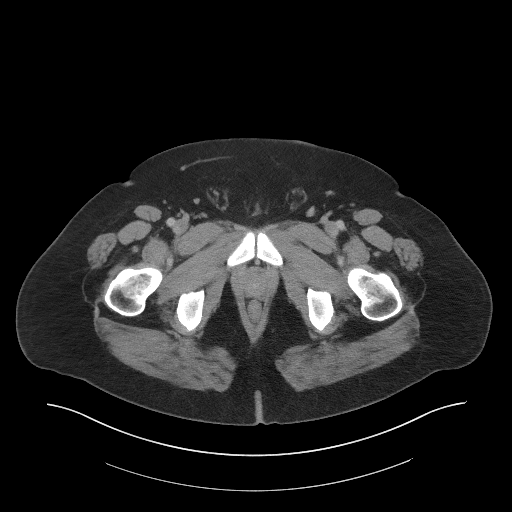
[im 24/114  soft-tissue]
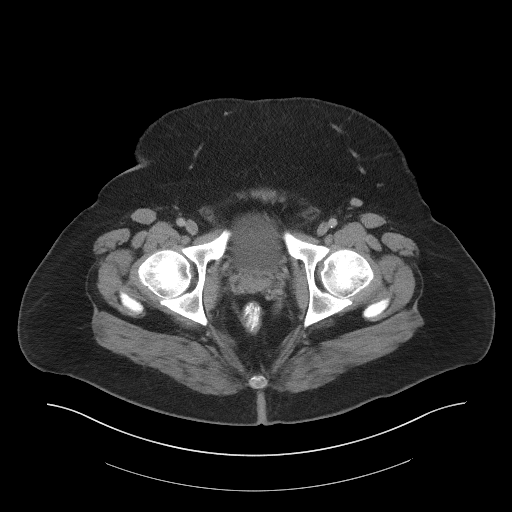
[im 30/114  soft-tissue]
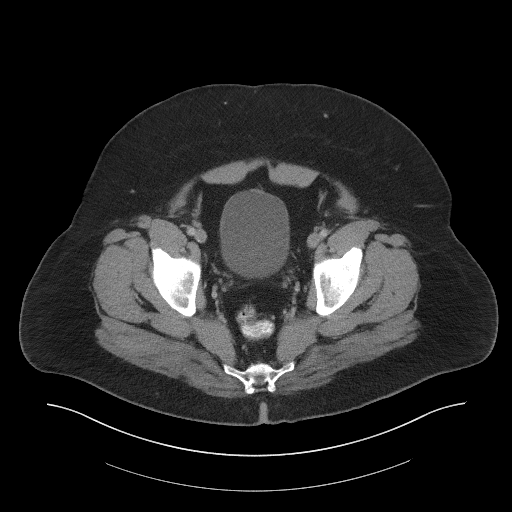
[im 42/114  soft-tissue]
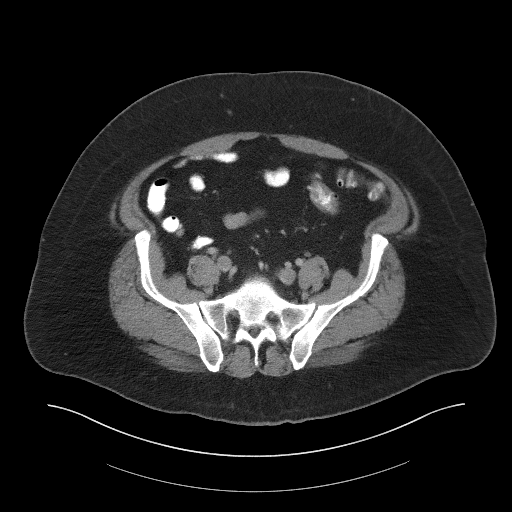
[im 48/114  soft-tissue]
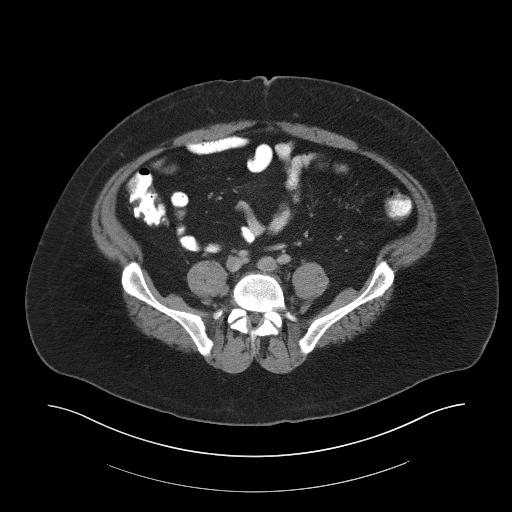
[im 60/114  soft-tissue]
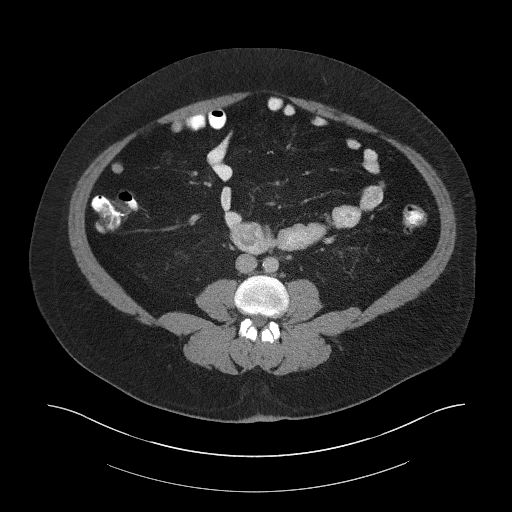
[im 66/114  soft-tissue]
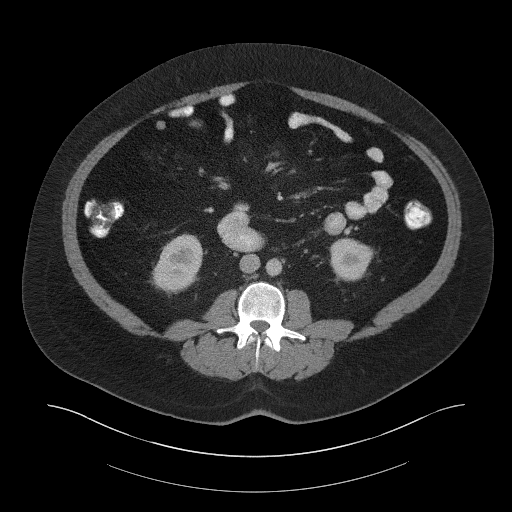
[im 72/114  soft-tissue]
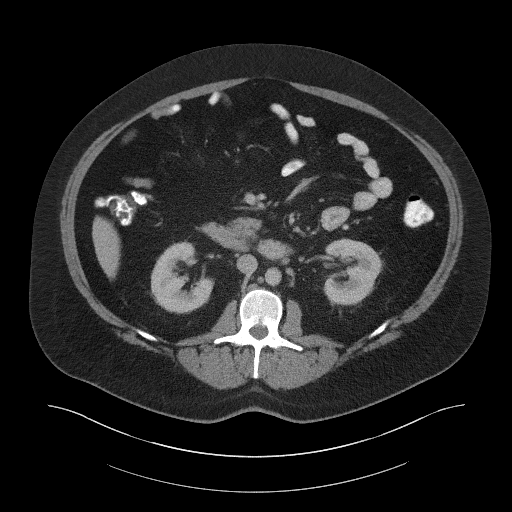
[im 72/114  bone]
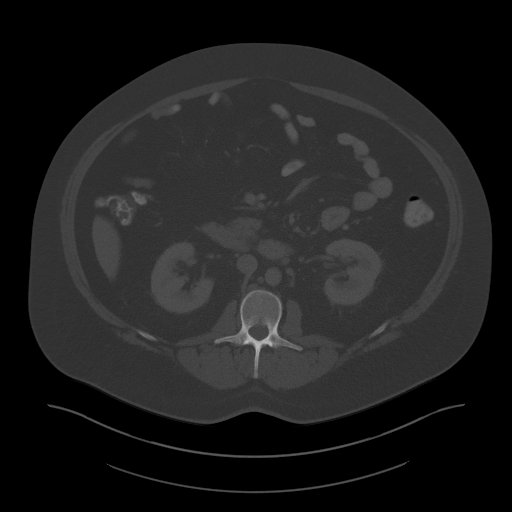
[im 84/114  soft-tissue]
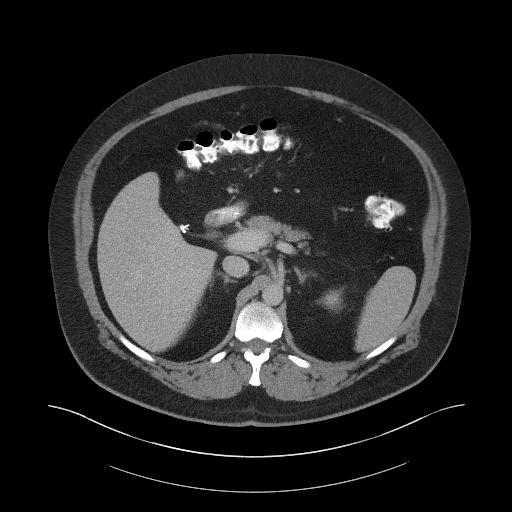
[im 90/114  soft-tissue]
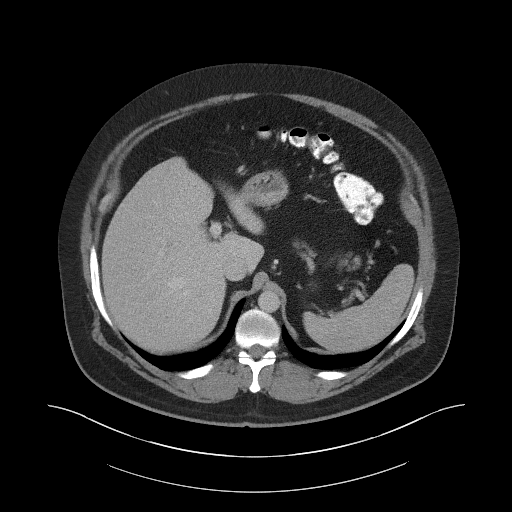
[im 96/114  soft-tissue]
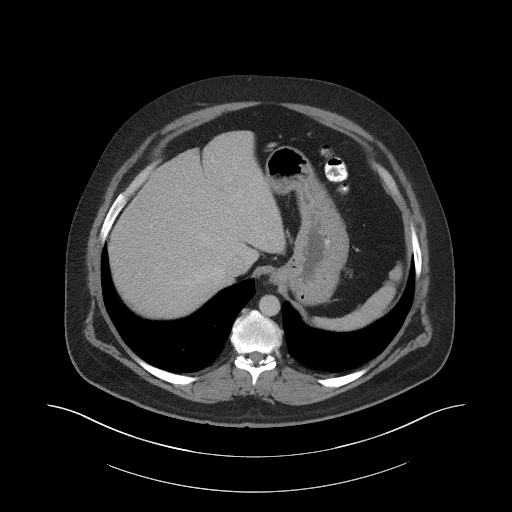
[im 108/114  soft-tissue]
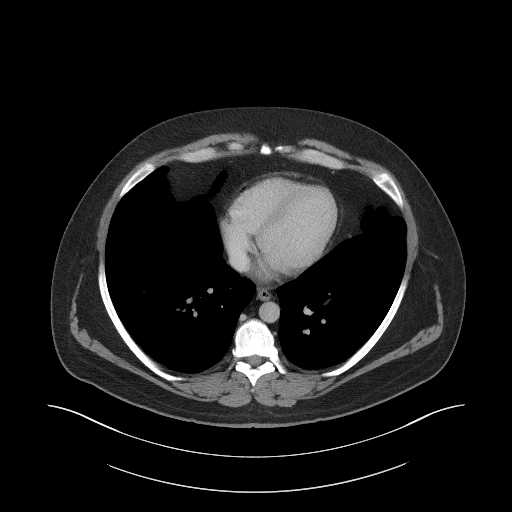

[Series 5: coronal st · coronal · 0.93mm/px · 3 of 128 slices shown]
[im 43/128  soft-tissue]
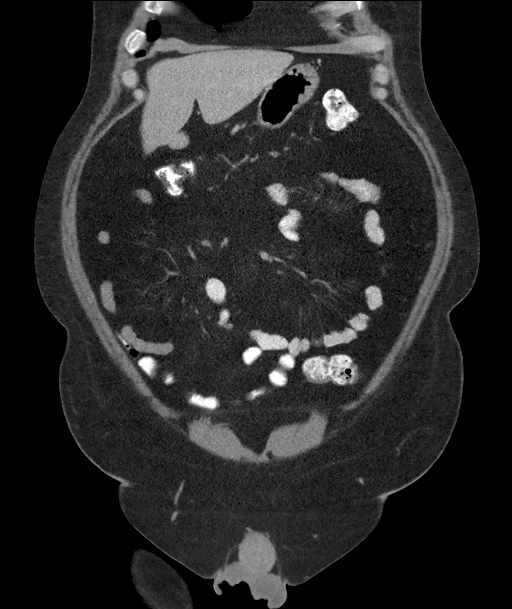
[im 57/128  soft-tissue]
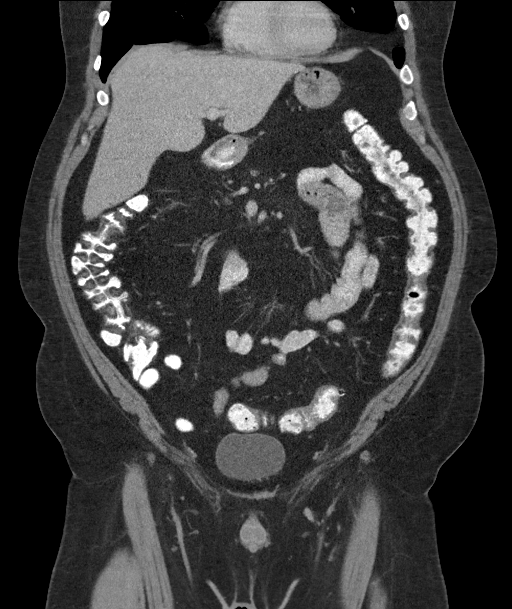
[im 71/128  soft-tissue]
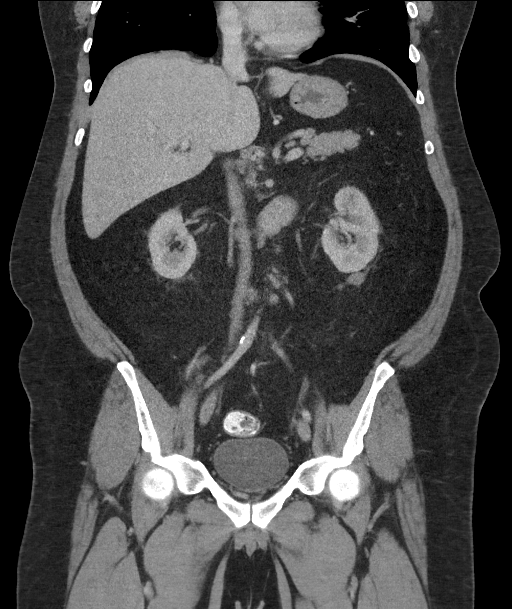

[16 of 46 positions shown; findings below may reference images not displayed]

FINDINGS: Lower chest: Scattered scarring in the lung bases. Heart size
normal. No pericardial or pleural effusion. Distal esophagus is
unremarkable.

Hepatobiliary: Subcentimeter low-attenuation lesions in the liver
are too small to characterize. Cholecystectomy. No biliary ductal
dilatation.

Pancreas: Negative.

Spleen: Negative.

Adrenals/Urinary Tract: Adrenal glands are unremarkable. Fluid
density lesion in the interpolar left kidney measures 3.2 cm,
indicative of a cyst. Hyperdense lesion in the lower pole left
kidney measures 1.9 cm. Note is made that it measures 35-36
Hounsfield units on portal venous and delayed nephrographic phase
imaging, favoring a hyperdense cyst. However, definitive
characterization is limited without precontrast imaging.
Subcentimeter low-attenuation lesions in the kidneys are too small
to definitively characterize. Ureters are decompressed. Bladder is
grossly unremarkable.

Stomach/Bowel: Stomach, small bowel, appendix and colon are
unremarkable.

Vascular/Lymphatic: Atherosclerotic calcification of the aorta
without aneurysm. No pathologically enlarged lymph nodes.

Reproductive: Prostate is normal in size.

Other: No free fluid.  Mesenteries and peritoneum are unremarkable.

Musculoskeletal: No worrisome lytic or sclerotic lesions. Slight
retrolisthesis of L5 on S1 with vacuum disc phenomenon and mild
endplate degenerative changes.
IMPRESSION: 1. No findings to explain the patient's given symptoms.
2. Hyperdense lesion in the lower pole left kidney cannot be further
characterized without precontrast imaging. Further evaluation with
pre and post contrast MRI or CT should be considered. MRI is
preferred in younger patients (due to lack of ionizing radiation)
and for evaluating calcified lesion(s).
3.  Aortic atherosclerosis ([id number]-170.0).

## 2021-01-26 DIAGNOSIS — Z9181 History of falling: Secondary | ICD-10-CM | POA: Diagnosis not present

## 2021-01-26 DIAGNOSIS — Z0000 Encounter for general adult medical examination without abnormal findings: Secondary | ICD-10-CM | POA: Diagnosis not present

## 2021-01-26 DIAGNOSIS — Z1331 Encounter for screening for depression: Secondary | ICD-10-CM | POA: Diagnosis not present

## 2021-01-26 DIAGNOSIS — E785 Hyperlipidemia, unspecified: Secondary | ICD-10-CM | POA: Diagnosis not present

## 2021-04-04 DIAGNOSIS — Z79899 Other long term (current) drug therapy: Secondary | ICD-10-CM | POA: Diagnosis not present

## 2021-04-04 DIAGNOSIS — F900 Attention-deficit hyperactivity disorder, predominantly inattentive type: Secondary | ICD-10-CM | POA: Diagnosis not present

## 2021-04-04 DIAGNOSIS — F4001 Agoraphobia with panic disorder: Secondary | ICD-10-CM | POA: Diagnosis not present

## 2021-04-04 DIAGNOSIS — F429 Obsessive-compulsive disorder, unspecified: Secondary | ICD-10-CM | POA: Diagnosis not present

## 2021-04-04 DIAGNOSIS — F411 Generalized anxiety disorder: Secondary | ICD-10-CM | POA: Diagnosis not present

## 2021-04-07 DIAGNOSIS — M25521 Pain in right elbow: Secondary | ICD-10-CM | POA: Diagnosis not present

## 2021-04-13 DIAGNOSIS — R7989 Other specified abnormal findings of blood chemistry: Secondary | ICD-10-CM | POA: Diagnosis not present

## 2021-04-13 DIAGNOSIS — K219 Gastro-esophageal reflux disease without esophagitis: Secondary | ICD-10-CM | POA: Diagnosis not present

## 2021-04-13 DIAGNOSIS — E782 Mixed hyperlipidemia: Secondary | ICD-10-CM | POA: Diagnosis not present

## 2021-04-13 DIAGNOSIS — J449 Chronic obstructive pulmonary disease, unspecified: Secondary | ICD-10-CM | POA: Diagnosis not present

## 2021-04-13 DIAGNOSIS — F411 Generalized anxiety disorder: Secondary | ICD-10-CM | POA: Diagnosis not present

## 2021-04-13 DIAGNOSIS — I10 Essential (primary) hypertension: Secondary | ICD-10-CM | POA: Diagnosis not present

## 2021-04-13 DIAGNOSIS — M159 Polyosteoarthritis, unspecified: Secondary | ICD-10-CM | POA: Diagnosis not present

## 2021-06-02 DIAGNOSIS — M7711 Lateral epicondylitis, right elbow: Secondary | ICD-10-CM | POA: Diagnosis not present

## 2021-06-02 DIAGNOSIS — M25521 Pain in right elbow: Secondary | ICD-10-CM | POA: Diagnosis not present

## 2021-06-27 DIAGNOSIS — F900 Attention-deficit hyperactivity disorder, predominantly inattentive type: Secondary | ICD-10-CM | POA: Diagnosis not present

## 2021-06-27 DIAGNOSIS — F4001 Agoraphobia with panic disorder: Secondary | ICD-10-CM | POA: Diagnosis not present

## 2021-06-27 DIAGNOSIS — F411 Generalized anxiety disorder: Secondary | ICD-10-CM | POA: Diagnosis not present

## 2021-06-27 DIAGNOSIS — F429 Obsessive-compulsive disorder, unspecified: Secondary | ICD-10-CM | POA: Diagnosis not present

## 2021-06-27 DIAGNOSIS — Z79899 Other long term (current) drug therapy: Secondary | ICD-10-CM | POA: Diagnosis not present

## 2021-07-27 DIAGNOSIS — K219 Gastro-esophageal reflux disease without esophagitis: Secondary | ICD-10-CM | POA: Diagnosis not present

## 2021-07-27 DIAGNOSIS — F17200 Nicotine dependence, unspecified, uncomplicated: Secondary | ICD-10-CM | POA: Diagnosis not present

## 2021-07-27 DIAGNOSIS — N400 Enlarged prostate without lower urinary tract symptoms: Secondary | ICD-10-CM | POA: Diagnosis not present

## 2021-07-27 DIAGNOSIS — F4310 Post-traumatic stress disorder, unspecified: Secondary | ICD-10-CM | POA: Diagnosis not present

## 2021-07-27 DIAGNOSIS — F3341 Major depressive disorder, recurrent, in partial remission: Secondary | ICD-10-CM | POA: Diagnosis not present

## 2021-07-27 DIAGNOSIS — E782 Mixed hyperlipidemia: Secondary | ICD-10-CM | POA: Diagnosis not present

## 2021-07-27 DIAGNOSIS — J449 Chronic obstructive pulmonary disease, unspecified: Secondary | ICD-10-CM | POA: Diagnosis not present

## 2021-07-27 DIAGNOSIS — Z23 Encounter for immunization: Secondary | ICD-10-CM | POA: Diagnosis not present

## 2021-07-27 DIAGNOSIS — J309 Allergic rhinitis, unspecified: Secondary | ICD-10-CM | POA: Diagnosis not present

## 2021-07-27 DIAGNOSIS — F411 Generalized anxiety disorder: Secondary | ICD-10-CM | POA: Diagnosis not present

## 2021-07-27 DIAGNOSIS — E291 Testicular hypofunction: Secondary | ICD-10-CM | POA: Diagnosis not present

## 2021-07-27 DIAGNOSIS — I10 Essential (primary) hypertension: Secondary | ICD-10-CM | POA: Diagnosis not present

## 2021-08-25 DIAGNOSIS — N400 Enlarged prostate without lower urinary tract symptoms: Secondary | ICD-10-CM | POA: Diagnosis not present

## 2021-08-25 DIAGNOSIS — F17200 Nicotine dependence, unspecified, uncomplicated: Secondary | ICD-10-CM | POA: Diagnosis not present

## 2021-08-25 DIAGNOSIS — M159 Polyosteoarthritis, unspecified: Secondary | ICD-10-CM | POA: Diagnosis not present

## 2021-08-25 DIAGNOSIS — J309 Allergic rhinitis, unspecified: Secondary | ICD-10-CM | POA: Diagnosis not present

## 2021-08-25 DIAGNOSIS — F3341 Major depressive disorder, recurrent, in partial remission: Secondary | ICD-10-CM | POA: Diagnosis not present

## 2021-08-25 DIAGNOSIS — F411 Generalized anxiety disorder: Secondary | ICD-10-CM | POA: Diagnosis not present

## 2021-08-25 DIAGNOSIS — K219 Gastro-esophageal reflux disease without esophagitis: Secondary | ICD-10-CM | POA: Diagnosis not present

## 2021-08-25 DIAGNOSIS — F4310 Post-traumatic stress disorder, unspecified: Secondary | ICD-10-CM | POA: Diagnosis not present

## 2021-08-25 DIAGNOSIS — I10 Essential (primary) hypertension: Secondary | ICD-10-CM | POA: Diagnosis not present

## 2021-08-25 DIAGNOSIS — E291 Testicular hypofunction: Secondary | ICD-10-CM | POA: Diagnosis not present

## 2021-08-25 DIAGNOSIS — E782 Mixed hyperlipidemia: Secondary | ICD-10-CM | POA: Diagnosis not present

## 2021-08-25 DIAGNOSIS — J449 Chronic obstructive pulmonary disease, unspecified: Secondary | ICD-10-CM | POA: Diagnosis not present

## 2021-09-21 DIAGNOSIS — F900 Attention-deficit hyperactivity disorder, predominantly inattentive type: Secondary | ICD-10-CM | POA: Diagnosis not present

## 2021-09-21 DIAGNOSIS — F429 Obsessive-compulsive disorder, unspecified: Secondary | ICD-10-CM | POA: Diagnosis not present

## 2021-09-21 DIAGNOSIS — F411 Generalized anxiety disorder: Secondary | ICD-10-CM | POA: Diagnosis not present

## 2021-09-21 DIAGNOSIS — F4001 Agoraphobia with panic disorder: Secondary | ICD-10-CM | POA: Diagnosis not present

## 2021-09-21 DIAGNOSIS — Z79899 Other long term (current) drug therapy: Secondary | ICD-10-CM | POA: Diagnosis not present

## 2021-12-01 DIAGNOSIS — E291 Testicular hypofunction: Secondary | ICD-10-CM | POA: Diagnosis not present

## 2021-12-01 DIAGNOSIS — J449 Chronic obstructive pulmonary disease, unspecified: Secondary | ICD-10-CM | POA: Diagnosis not present

## 2021-12-01 DIAGNOSIS — N400 Enlarged prostate without lower urinary tract symptoms: Secondary | ICD-10-CM | POA: Diagnosis not present

## 2021-12-01 DIAGNOSIS — I10 Essential (primary) hypertension: Secondary | ICD-10-CM | POA: Diagnosis not present

## 2021-12-01 DIAGNOSIS — F17200 Nicotine dependence, unspecified, uncomplicated: Secondary | ICD-10-CM | POA: Diagnosis not present

## 2021-12-01 DIAGNOSIS — M159 Polyosteoarthritis, unspecified: Secondary | ICD-10-CM | POA: Diagnosis not present

## 2021-12-01 DIAGNOSIS — F411 Generalized anxiety disorder: Secondary | ICD-10-CM | POA: Diagnosis not present

## 2021-12-01 DIAGNOSIS — K219 Gastro-esophageal reflux disease without esophagitis: Secondary | ICD-10-CM | POA: Diagnosis not present

## 2021-12-01 DIAGNOSIS — F3341 Major depressive disorder, recurrent, in partial remission: Secondary | ICD-10-CM | POA: Diagnosis not present

## 2021-12-01 DIAGNOSIS — F4310 Post-traumatic stress disorder, unspecified: Secondary | ICD-10-CM | POA: Diagnosis not present

## 2021-12-01 DIAGNOSIS — E782 Mixed hyperlipidemia: Secondary | ICD-10-CM | POA: Diagnosis not present

## 2021-12-01 DIAGNOSIS — J309 Allergic rhinitis, unspecified: Secondary | ICD-10-CM | POA: Diagnosis not present

## 2022-01-13 DIAGNOSIS — F429 Obsessive-compulsive disorder, unspecified: Secondary | ICD-10-CM | POA: Diagnosis not present

## 2022-01-13 DIAGNOSIS — F4001 Agoraphobia with panic disorder: Secondary | ICD-10-CM | POA: Diagnosis not present

## 2022-01-13 DIAGNOSIS — F900 Attention-deficit hyperactivity disorder, predominantly inattentive type: Secondary | ICD-10-CM | POA: Diagnosis not present

## 2022-01-13 DIAGNOSIS — Z79899 Other long term (current) drug therapy: Secondary | ICD-10-CM | POA: Diagnosis not present

## 2022-01-13 DIAGNOSIS — F411 Generalized anxiety disorder: Secondary | ICD-10-CM | POA: Diagnosis not present

## 2022-03-14 DIAGNOSIS — K219 Gastro-esophageal reflux disease without esophagitis: Secondary | ICD-10-CM | POA: Diagnosis not present

## 2022-03-14 DIAGNOSIS — N400 Enlarged prostate without lower urinary tract symptoms: Secondary | ICD-10-CM | POA: Diagnosis not present

## 2022-03-14 DIAGNOSIS — Z6831 Body mass index (BMI) 31.0-31.9, adult: Secondary | ICD-10-CM | POA: Diagnosis not present

## 2022-03-14 DIAGNOSIS — E782 Mixed hyperlipidemia: Secondary | ICD-10-CM | POA: Diagnosis not present

## 2022-03-14 DIAGNOSIS — E291 Testicular hypofunction: Secondary | ICD-10-CM | POA: Diagnosis not present

## 2022-03-14 DIAGNOSIS — J309 Allergic rhinitis, unspecified: Secondary | ICD-10-CM | POA: Diagnosis not present

## 2022-03-14 DIAGNOSIS — F3341 Major depressive disorder, recurrent, in partial remission: Secondary | ICD-10-CM | POA: Diagnosis not present

## 2022-03-14 DIAGNOSIS — F17200 Nicotine dependence, unspecified, uncomplicated: Secondary | ICD-10-CM | POA: Diagnosis not present

## 2022-03-14 DIAGNOSIS — I10 Essential (primary) hypertension: Secondary | ICD-10-CM | POA: Diagnosis not present

## 2022-03-14 DIAGNOSIS — F411 Generalized anxiety disorder: Secondary | ICD-10-CM | POA: Diagnosis not present

## 2022-03-14 DIAGNOSIS — F4310 Post-traumatic stress disorder, unspecified: Secondary | ICD-10-CM | POA: Diagnosis not present

## 2022-03-14 DIAGNOSIS — J449 Chronic obstructive pulmonary disease, unspecified: Secondary | ICD-10-CM | POA: Diagnosis not present

## 2022-05-08 DIAGNOSIS — F4001 Agoraphobia with panic disorder: Secondary | ICD-10-CM | POA: Diagnosis not present

## 2022-05-08 DIAGNOSIS — F429 Obsessive-compulsive disorder, unspecified: Secondary | ICD-10-CM | POA: Diagnosis not present

## 2022-05-08 DIAGNOSIS — Z79899 Other long term (current) drug therapy: Secondary | ICD-10-CM | POA: Diagnosis not present

## 2022-05-08 DIAGNOSIS — F900 Attention-deficit hyperactivity disorder, predominantly inattentive type: Secondary | ICD-10-CM | POA: Diagnosis not present

## 2022-05-08 DIAGNOSIS — F411 Generalized anxiety disorder: Secondary | ICD-10-CM | POA: Diagnosis not present

## 2022-06-01 DIAGNOSIS — E291 Testicular hypofunction: Secondary | ICD-10-CM | POA: Diagnosis not present

## 2022-06-01 DIAGNOSIS — E785 Hyperlipidemia, unspecified: Secondary | ICD-10-CM | POA: Diagnosis not present

## 2022-06-01 DIAGNOSIS — I10 Essential (primary) hypertension: Secondary | ICD-10-CM | POA: Diagnosis not present

## 2022-06-01 DIAGNOSIS — R5383 Other fatigue: Secondary | ICD-10-CM | POA: Diagnosis not present

## 2022-06-12 ENCOUNTER — Encounter: Payer: Self-pay | Admitting: *Deleted

## 2022-06-29 DIAGNOSIS — T7840XA Allergy, unspecified, initial encounter: Secondary | ICD-10-CM | POA: Diagnosis not present

## 2022-06-29 DIAGNOSIS — J439 Emphysema, unspecified: Secondary | ICD-10-CM | POA: Diagnosis not present

## 2022-06-29 DIAGNOSIS — K219 Gastro-esophageal reflux disease without esophagitis: Secondary | ICD-10-CM | POA: Diagnosis not present

## 2022-06-29 DIAGNOSIS — E291 Testicular hypofunction: Secondary | ICD-10-CM | POA: Diagnosis not present

## 2022-06-29 DIAGNOSIS — Z6828 Body mass index (BMI) 28.0-28.9, adult: Secondary | ICD-10-CM | POA: Diagnosis not present

## 2022-06-29 DIAGNOSIS — J309 Allergic rhinitis, unspecified: Secondary | ICD-10-CM | POA: Diagnosis not present

## 2022-06-29 DIAGNOSIS — F4310 Post-traumatic stress disorder, unspecified: Secondary | ICD-10-CM | POA: Diagnosis not present

## 2022-06-29 DIAGNOSIS — I10 Essential (primary) hypertension: Secondary | ICD-10-CM | POA: Diagnosis not present

## 2022-06-29 DIAGNOSIS — F3341 Major depressive disorder, recurrent, in partial remission: Secondary | ICD-10-CM | POA: Diagnosis not present

## 2022-06-29 DIAGNOSIS — N400 Enlarged prostate without lower urinary tract symptoms: Secondary | ICD-10-CM | POA: Diagnosis not present

## 2022-06-29 DIAGNOSIS — E785 Hyperlipidemia, unspecified: Secondary | ICD-10-CM | POA: Diagnosis not present

## 2022-06-29 DIAGNOSIS — D72829 Elevated white blood cell count, unspecified: Secondary | ICD-10-CM | POA: Diagnosis not present

## 2022-08-02 DIAGNOSIS — Z23 Encounter for immunization: Secondary | ICD-10-CM | POA: Diagnosis not present

## 2022-08-11 DIAGNOSIS — K219 Gastro-esophageal reflux disease without esophagitis: Secondary | ICD-10-CM | POA: Diagnosis not present

## 2022-08-11 DIAGNOSIS — J309 Allergic rhinitis, unspecified: Secondary | ICD-10-CM | POA: Diagnosis not present

## 2022-08-11 DIAGNOSIS — E785 Hyperlipidemia, unspecified: Secondary | ICD-10-CM | POA: Diagnosis not present

## 2022-08-11 DIAGNOSIS — D72829 Elevated white blood cell count, unspecified: Secondary | ICD-10-CM | POA: Diagnosis not present

## 2022-08-11 DIAGNOSIS — I10 Essential (primary) hypertension: Secondary | ICD-10-CM | POA: Diagnosis not present

## 2022-08-11 DIAGNOSIS — E291 Testicular hypofunction: Secondary | ICD-10-CM | POA: Diagnosis not present

## 2022-08-11 DIAGNOSIS — F3341 Major depressive disorder, recurrent, in partial remission: Secondary | ICD-10-CM | POA: Diagnosis not present

## 2022-08-11 DIAGNOSIS — F411 Generalized anxiety disorder: Secondary | ICD-10-CM | POA: Diagnosis not present

## 2022-08-11 DIAGNOSIS — T7840XA Allergy, unspecified, initial encounter: Secondary | ICD-10-CM | POA: Diagnosis not present

## 2022-08-11 DIAGNOSIS — F4310 Post-traumatic stress disorder, unspecified: Secondary | ICD-10-CM | POA: Diagnosis not present

## 2022-08-11 DIAGNOSIS — N400 Enlarged prostate without lower urinary tract symptoms: Secondary | ICD-10-CM | POA: Diagnosis not present

## 2022-08-11 DIAGNOSIS — J439 Emphysema, unspecified: Secondary | ICD-10-CM | POA: Diagnosis not present

## 2022-08-31 ENCOUNTER — Encounter: Payer: Self-pay | Admitting: *Deleted

## 2022-09-04 DIAGNOSIS — F411 Generalized anxiety disorder: Secondary | ICD-10-CM | POA: Diagnosis not present

## 2022-09-04 DIAGNOSIS — F900 Attention-deficit hyperactivity disorder, predominantly inattentive type: Secondary | ICD-10-CM | POA: Diagnosis not present

## 2022-09-04 DIAGNOSIS — G4700 Insomnia, unspecified: Secondary | ICD-10-CM | POA: Diagnosis not present

## 2022-09-04 DIAGNOSIS — F4001 Agoraphobia with panic disorder: Secondary | ICD-10-CM | POA: Diagnosis not present

## 2022-09-04 DIAGNOSIS — F429 Obsessive-compulsive disorder, unspecified: Secondary | ICD-10-CM | POA: Diagnosis not present

## 2022-09-04 DIAGNOSIS — Z79899 Other long term (current) drug therapy: Secondary | ICD-10-CM | POA: Diagnosis not present

## 2022-09-15 DIAGNOSIS — J309 Allergic rhinitis, unspecified: Secondary | ICD-10-CM | POA: Diagnosis not present

## 2022-09-15 DIAGNOSIS — F4310 Post-traumatic stress disorder, unspecified: Secondary | ICD-10-CM | POA: Diagnosis not present

## 2022-09-15 DIAGNOSIS — J439 Emphysema, unspecified: Secondary | ICD-10-CM | POA: Diagnosis not present

## 2022-09-15 DIAGNOSIS — N400 Enlarged prostate without lower urinary tract symptoms: Secondary | ICD-10-CM | POA: Diagnosis not present

## 2022-09-15 DIAGNOSIS — F3341 Major depressive disorder, recurrent, in partial remission: Secondary | ICD-10-CM | POA: Diagnosis not present

## 2022-09-15 DIAGNOSIS — E291 Testicular hypofunction: Secondary | ICD-10-CM | POA: Diagnosis not present

## 2022-09-15 DIAGNOSIS — D72829 Elevated white blood cell count, unspecified: Secondary | ICD-10-CM | POA: Diagnosis not present

## 2022-09-15 DIAGNOSIS — F411 Generalized anxiety disorder: Secondary | ICD-10-CM | POA: Diagnosis not present

## 2022-09-15 DIAGNOSIS — E785 Hyperlipidemia, unspecified: Secondary | ICD-10-CM | POA: Diagnosis not present

## 2022-09-15 DIAGNOSIS — T7840XA Allergy, unspecified, initial encounter: Secondary | ICD-10-CM | POA: Diagnosis not present

## 2022-09-15 DIAGNOSIS — K219 Gastro-esophageal reflux disease without esophagitis: Secondary | ICD-10-CM | POA: Diagnosis not present

## 2022-09-15 DIAGNOSIS — I10 Essential (primary) hypertension: Secondary | ICD-10-CM | POA: Diagnosis not present

## 2023-11-25 DIAGNOSIS — I517 Cardiomegaly: Secondary | ICD-10-CM | POA: Diagnosis not present

## 2023-12-01 DIAGNOSIS — J9601 Acute respiratory failure with hypoxia: Secondary | ICD-10-CM

## 2023-12-01 DIAGNOSIS — J9501 Hemorrhage from tracheostomy stoma: Secondary | ICD-10-CM

## 2023-12-01 DIAGNOSIS — R1312 Dysphagia, oropharyngeal phase: Secondary | ICD-10-CM

## 2023-12-01 DIAGNOSIS — I10 Essential (primary) hypertension: Secondary | ICD-10-CM

## 2023-12-01 DIAGNOSIS — J449 Chronic obstructive pulmonary disease, unspecified: Secondary | ICD-10-CM

## 2023-12-02 DIAGNOSIS — J449 Chronic obstructive pulmonary disease, unspecified: Secondary | ICD-10-CM | POA: Diagnosis not present

## 2023-12-02 DIAGNOSIS — J189 Pneumonia, unspecified organism: Secondary | ICD-10-CM | POA: Diagnosis not present

## 2023-12-02 DIAGNOSIS — J9601 Acute respiratory failure with hypoxia: Secondary | ICD-10-CM | POA: Diagnosis not present

## 2023-12-02 DIAGNOSIS — I10 Essential (primary) hypertension: Secondary | ICD-10-CM | POA: Diagnosis not present

## 2023-12-02 DIAGNOSIS — J930 Spontaneous tension pneumothorax: Secondary | ICD-10-CM | POA: Diagnosis not present

## 2023-12-02 DIAGNOSIS — J9501 Hemorrhage from tracheostomy stoma: Secondary | ICD-10-CM | POA: Diagnosis not present

## 2023-12-02 DIAGNOSIS — Z930 Tracheostomy status: Secondary | ICD-10-CM | POA: Diagnosis not present

## 2023-12-02 DIAGNOSIS — J9621 Acute and chronic respiratory failure with hypoxia: Secondary | ICD-10-CM | POA: Diagnosis not present

## 2023-12-03 DIAGNOSIS — J9601 Acute respiratory failure with hypoxia: Secondary | ICD-10-CM | POA: Diagnosis not present

## 2023-12-03 DIAGNOSIS — J9501 Hemorrhage from tracheostomy stoma: Secondary | ICD-10-CM | POA: Diagnosis not present

## 2023-12-03 DIAGNOSIS — R1312 Dysphagia, oropharyngeal phase: Secondary | ICD-10-CM

## 2023-12-03 DIAGNOSIS — Z930 Tracheostomy status: Secondary | ICD-10-CM | POA: Diagnosis not present

## 2023-12-03 DIAGNOSIS — J189 Pneumonia, unspecified organism: Secondary | ICD-10-CM | POA: Diagnosis not present

## 2023-12-03 DIAGNOSIS — J930 Spontaneous tension pneumothorax: Secondary | ICD-10-CM | POA: Diagnosis not present

## 2023-12-03 DIAGNOSIS — J9621 Acute and chronic respiratory failure with hypoxia: Secondary | ICD-10-CM | POA: Diagnosis not present

## 2023-12-03 DIAGNOSIS — J449 Chronic obstructive pulmonary disease, unspecified: Secondary | ICD-10-CM | POA: Diagnosis not present

## 2023-12-03 DIAGNOSIS — I10 Essential (primary) hypertension: Secondary | ICD-10-CM | POA: Diagnosis not present

## 2023-12-04 DIAGNOSIS — J930 Spontaneous tension pneumothorax: Secondary | ICD-10-CM | POA: Diagnosis not present

## 2023-12-04 DIAGNOSIS — J449 Chronic obstructive pulmonary disease, unspecified: Secondary | ICD-10-CM | POA: Diagnosis not present

## 2023-12-04 DIAGNOSIS — Z930 Tracheostomy status: Secondary | ICD-10-CM | POA: Diagnosis not present

## 2023-12-04 DIAGNOSIS — J189 Pneumonia, unspecified organism: Secondary | ICD-10-CM | POA: Diagnosis not present

## 2023-12-04 DIAGNOSIS — I10 Essential (primary) hypertension: Secondary | ICD-10-CM | POA: Diagnosis not present

## 2023-12-04 DIAGNOSIS — J9601 Acute respiratory failure with hypoxia: Secondary | ICD-10-CM | POA: Diagnosis not present

## 2023-12-04 DIAGNOSIS — J9621 Acute and chronic respiratory failure with hypoxia: Secondary | ICD-10-CM | POA: Diagnosis not present

## 2023-12-04 DIAGNOSIS — J9501 Hemorrhage from tracheostomy stoma: Secondary | ICD-10-CM | POA: Diagnosis not present

## 2023-12-05 DIAGNOSIS — J9501 Hemorrhage from tracheostomy stoma: Secondary | ICD-10-CM | POA: Diagnosis not present

## 2023-12-05 DIAGNOSIS — J930 Spontaneous tension pneumothorax: Secondary | ICD-10-CM | POA: Diagnosis not present

## 2023-12-05 DIAGNOSIS — J9601 Acute respiratory failure with hypoxia: Secondary | ICD-10-CM | POA: Diagnosis not present

## 2023-12-05 DIAGNOSIS — J449 Chronic obstructive pulmonary disease, unspecified: Secondary | ICD-10-CM | POA: Diagnosis not present

## 2023-12-05 DIAGNOSIS — Z930 Tracheostomy status: Secondary | ICD-10-CM | POA: Diagnosis not present

## 2023-12-05 DIAGNOSIS — J9621 Acute and chronic respiratory failure with hypoxia: Secondary | ICD-10-CM | POA: Diagnosis not present

## 2023-12-05 DIAGNOSIS — I10 Essential (primary) hypertension: Secondary | ICD-10-CM | POA: Diagnosis not present

## 2023-12-05 DIAGNOSIS — J189 Pneumonia, unspecified organism: Secondary | ICD-10-CM | POA: Diagnosis not present

## 2023-12-06 DIAGNOSIS — J449 Chronic obstructive pulmonary disease, unspecified: Secondary | ICD-10-CM | POA: Diagnosis not present

## 2023-12-06 DIAGNOSIS — J9501 Hemorrhage from tracheostomy stoma: Secondary | ICD-10-CM | POA: Diagnosis not present

## 2023-12-06 DIAGNOSIS — J9621 Acute and chronic respiratory failure with hypoxia: Secondary | ICD-10-CM | POA: Diagnosis not present

## 2023-12-06 DIAGNOSIS — Z930 Tracheostomy status: Secondary | ICD-10-CM | POA: Diagnosis not present

## 2023-12-06 DIAGNOSIS — I10 Essential (primary) hypertension: Secondary | ICD-10-CM | POA: Diagnosis not present

## 2023-12-06 DIAGNOSIS — J9601 Acute respiratory failure with hypoxia: Secondary | ICD-10-CM | POA: Diagnosis not present

## 2023-12-06 DIAGNOSIS — J930 Spontaneous tension pneumothorax: Secondary | ICD-10-CM | POA: Diagnosis not present

## 2023-12-06 DIAGNOSIS — J189 Pneumonia, unspecified organism: Secondary | ICD-10-CM | POA: Diagnosis not present

## 2023-12-07 DIAGNOSIS — J9601 Acute respiratory failure with hypoxia: Secondary | ICD-10-CM | POA: Diagnosis not present

## 2023-12-07 DIAGNOSIS — J9501 Hemorrhage from tracheostomy stoma: Secondary | ICD-10-CM | POA: Diagnosis not present

## 2023-12-07 DIAGNOSIS — J449 Chronic obstructive pulmonary disease, unspecified: Secondary | ICD-10-CM | POA: Diagnosis not present

## 2023-12-07 DIAGNOSIS — I10 Essential (primary) hypertension: Secondary | ICD-10-CM | POA: Diagnosis not present

## 2023-12-08 DIAGNOSIS — I10 Essential (primary) hypertension: Secondary | ICD-10-CM

## 2023-12-08 DIAGNOSIS — J449 Chronic obstructive pulmonary disease, unspecified: Secondary | ICD-10-CM

## 2023-12-08 DIAGNOSIS — R1312 Dysphagia, oropharyngeal phase: Secondary | ICD-10-CM

## 2023-12-08 DIAGNOSIS — J9601 Acute respiratory failure with hypoxia: Secondary | ICD-10-CM

## 2023-12-08 DIAGNOSIS — J9501 Hemorrhage from tracheostomy stoma: Secondary | ICD-10-CM

## 2023-12-09 DIAGNOSIS — J930 Spontaneous tension pneumothorax: Secondary | ICD-10-CM | POA: Diagnosis not present

## 2023-12-09 DIAGNOSIS — J9621 Acute and chronic respiratory failure with hypoxia: Secondary | ICD-10-CM | POA: Diagnosis not present

## 2023-12-09 DIAGNOSIS — I10 Essential (primary) hypertension: Secondary | ICD-10-CM | POA: Diagnosis not present

## 2023-12-09 DIAGNOSIS — J449 Chronic obstructive pulmonary disease, unspecified: Secondary | ICD-10-CM | POA: Diagnosis not present

## 2023-12-09 DIAGNOSIS — J189 Pneumonia, unspecified organism: Secondary | ICD-10-CM | POA: Diagnosis not present

## 2023-12-09 DIAGNOSIS — J9501 Hemorrhage from tracheostomy stoma: Secondary | ICD-10-CM | POA: Diagnosis not present

## 2023-12-09 DIAGNOSIS — J9601 Acute respiratory failure with hypoxia: Secondary | ICD-10-CM | POA: Diagnosis not present

## 2023-12-09 DIAGNOSIS — Z930 Tracheostomy status: Secondary | ICD-10-CM | POA: Diagnosis not present

## 2023-12-10 DIAGNOSIS — I10 Essential (primary) hypertension: Secondary | ICD-10-CM

## 2023-12-10 DIAGNOSIS — J9621 Acute and chronic respiratory failure with hypoxia: Secondary | ICD-10-CM | POA: Diagnosis not present

## 2023-12-10 DIAGNOSIS — J189 Pneumonia, unspecified organism: Secondary | ICD-10-CM | POA: Diagnosis not present

## 2023-12-10 DIAGNOSIS — J9601 Acute respiratory failure with hypoxia: Secondary | ICD-10-CM

## 2023-12-10 DIAGNOSIS — J930 Spontaneous tension pneumothorax: Secondary | ICD-10-CM | POA: Diagnosis not present

## 2023-12-10 DIAGNOSIS — Z930 Tracheostomy status: Secondary | ICD-10-CM | POA: Diagnosis not present

## 2023-12-10 DIAGNOSIS — J449 Chronic obstructive pulmonary disease, unspecified: Secondary | ICD-10-CM

## 2023-12-10 DIAGNOSIS — J9501 Hemorrhage from tracheostomy stoma: Secondary | ICD-10-CM

## 2023-12-10 DIAGNOSIS — R1312 Dysphagia, oropharyngeal phase: Secondary | ICD-10-CM

## 2023-12-11 DIAGNOSIS — J189 Pneumonia, unspecified organism: Secondary | ICD-10-CM | POA: Diagnosis not present

## 2023-12-11 DIAGNOSIS — I10 Essential (primary) hypertension: Secondary | ICD-10-CM | POA: Diagnosis not present

## 2023-12-11 DIAGNOSIS — J930 Spontaneous tension pneumothorax: Secondary | ICD-10-CM | POA: Diagnosis not present

## 2023-12-11 DIAGNOSIS — J9501 Hemorrhage from tracheostomy stoma: Secondary | ICD-10-CM | POA: Diagnosis not present

## 2023-12-11 DIAGNOSIS — J9621 Acute and chronic respiratory failure with hypoxia: Secondary | ICD-10-CM | POA: Diagnosis not present

## 2023-12-11 DIAGNOSIS — J9601 Acute respiratory failure with hypoxia: Secondary | ICD-10-CM | POA: Diagnosis not present

## 2023-12-11 DIAGNOSIS — Z930 Tracheostomy status: Secondary | ICD-10-CM | POA: Diagnosis not present

## 2023-12-11 DIAGNOSIS — R1312 Dysphagia, oropharyngeal phase: Secondary | ICD-10-CM | POA: Diagnosis not present

## 2023-12-12 DIAGNOSIS — I10 Essential (primary) hypertension: Secondary | ICD-10-CM | POA: Diagnosis not present

## 2023-12-12 DIAGNOSIS — J9601 Acute respiratory failure with hypoxia: Secondary | ICD-10-CM | POA: Diagnosis not present

## 2023-12-12 DIAGNOSIS — J9501 Hemorrhage from tracheostomy stoma: Secondary | ICD-10-CM | POA: Diagnosis not present

## 2023-12-12 DIAGNOSIS — R1312 Dysphagia, oropharyngeal phase: Secondary | ICD-10-CM | POA: Diagnosis not present

## 2023-12-13 DIAGNOSIS — R1312 Dysphagia, oropharyngeal phase: Secondary | ICD-10-CM | POA: Diagnosis not present

## 2023-12-13 DIAGNOSIS — I10 Essential (primary) hypertension: Secondary | ICD-10-CM | POA: Diagnosis not present

## 2023-12-13 DIAGNOSIS — J9501 Hemorrhage from tracheostomy stoma: Secondary | ICD-10-CM | POA: Diagnosis not present

## 2023-12-13 DIAGNOSIS — J9601 Acute respiratory failure with hypoxia: Secondary | ICD-10-CM | POA: Diagnosis not present

## 2023-12-14 DIAGNOSIS — J9601 Acute respiratory failure with hypoxia: Secondary | ICD-10-CM | POA: Diagnosis not present

## 2023-12-14 DIAGNOSIS — J9501 Hemorrhage from tracheostomy stoma: Secondary | ICD-10-CM | POA: Diagnosis not present

## 2023-12-14 DIAGNOSIS — I10 Essential (primary) hypertension: Secondary | ICD-10-CM | POA: Diagnosis not present

## 2023-12-14 DIAGNOSIS — R1312 Dysphagia, oropharyngeal phase: Secondary | ICD-10-CM | POA: Diagnosis not present

## 2023-12-15 DIAGNOSIS — J9601 Acute respiratory failure with hypoxia: Secondary | ICD-10-CM | POA: Diagnosis not present

## 2023-12-15 DIAGNOSIS — J189 Pneumonia, unspecified organism: Secondary | ICD-10-CM | POA: Diagnosis not present

## 2023-12-15 DIAGNOSIS — Z930 Tracheostomy status: Secondary | ICD-10-CM | POA: Diagnosis not present

## 2023-12-15 DIAGNOSIS — J9501 Hemorrhage from tracheostomy stoma: Secondary | ICD-10-CM | POA: Diagnosis not present

## 2023-12-15 DIAGNOSIS — J930 Spontaneous tension pneumothorax: Secondary | ICD-10-CM | POA: Diagnosis not present

## 2023-12-15 DIAGNOSIS — I10 Essential (primary) hypertension: Secondary | ICD-10-CM | POA: Diagnosis not present

## 2023-12-15 DIAGNOSIS — R1312 Dysphagia, oropharyngeal phase: Secondary | ICD-10-CM | POA: Diagnosis not present

## 2023-12-15 DIAGNOSIS — J9621 Acute and chronic respiratory failure with hypoxia: Secondary | ICD-10-CM | POA: Diagnosis not present

## 2023-12-16 DIAGNOSIS — J9621 Acute and chronic respiratory failure with hypoxia: Secondary | ICD-10-CM | POA: Diagnosis not present

## 2023-12-16 DIAGNOSIS — J189 Pneumonia, unspecified organism: Secondary | ICD-10-CM | POA: Diagnosis not present

## 2023-12-16 DIAGNOSIS — J930 Spontaneous tension pneumothorax: Secondary | ICD-10-CM | POA: Diagnosis not present

## 2023-12-16 DIAGNOSIS — J9601 Acute respiratory failure with hypoxia: Secondary | ICD-10-CM | POA: Diagnosis not present

## 2023-12-16 DIAGNOSIS — I10 Essential (primary) hypertension: Secondary | ICD-10-CM | POA: Diagnosis not present

## 2023-12-16 DIAGNOSIS — R1312 Dysphagia, oropharyngeal phase: Secondary | ICD-10-CM | POA: Diagnosis not present

## 2023-12-16 DIAGNOSIS — Z930 Tracheostomy status: Secondary | ICD-10-CM | POA: Diagnosis not present

## 2023-12-16 DIAGNOSIS — J9501 Hemorrhage from tracheostomy stoma: Secondary | ICD-10-CM | POA: Diagnosis not present

## 2023-12-17 DIAGNOSIS — Z930 Tracheostomy status: Secondary | ICD-10-CM | POA: Diagnosis not present

## 2023-12-17 DIAGNOSIS — J449 Chronic obstructive pulmonary disease, unspecified: Secondary | ICD-10-CM

## 2023-12-17 DIAGNOSIS — J9621 Acute and chronic respiratory failure with hypoxia: Secondary | ICD-10-CM | POA: Diagnosis not present

## 2023-12-17 DIAGNOSIS — I10 Essential (primary) hypertension: Secondary | ICD-10-CM | POA: Diagnosis not present

## 2023-12-17 DIAGNOSIS — J930 Spontaneous tension pneumothorax: Secondary | ICD-10-CM | POA: Diagnosis not present

## 2023-12-17 DIAGNOSIS — J9501 Hemorrhage from tracheostomy stoma: Secondary | ICD-10-CM | POA: Diagnosis not present

## 2023-12-17 DIAGNOSIS — R1312 Dysphagia, oropharyngeal phase: Secondary | ICD-10-CM | POA: Diagnosis not present

## 2023-12-17 DIAGNOSIS — J9601 Acute respiratory failure with hypoxia: Secondary | ICD-10-CM | POA: Diagnosis not present

## 2023-12-17 DIAGNOSIS — J189 Pneumonia, unspecified organism: Secondary | ICD-10-CM | POA: Diagnosis not present

## 2023-12-18 DIAGNOSIS — J9501 Hemorrhage from tracheostomy stoma: Secondary | ICD-10-CM | POA: Diagnosis not present

## 2023-12-18 DIAGNOSIS — Z930 Tracheostomy status: Secondary | ICD-10-CM | POA: Diagnosis not present

## 2023-12-18 DIAGNOSIS — I10 Essential (primary) hypertension: Secondary | ICD-10-CM | POA: Diagnosis not present

## 2023-12-18 DIAGNOSIS — J9621 Acute and chronic respiratory failure with hypoxia: Secondary | ICD-10-CM | POA: Diagnosis not present

## 2023-12-18 DIAGNOSIS — R1312 Dysphagia, oropharyngeal phase: Secondary | ICD-10-CM | POA: Diagnosis not present

## 2023-12-18 DIAGNOSIS — J9601 Acute respiratory failure with hypoxia: Secondary | ICD-10-CM | POA: Diagnosis not present

## 2023-12-18 DIAGNOSIS — J189 Pneumonia, unspecified organism: Secondary | ICD-10-CM | POA: Diagnosis not present

## 2023-12-18 DIAGNOSIS — J930 Spontaneous tension pneumothorax: Secondary | ICD-10-CM | POA: Diagnosis not present

## 2023-12-19 DIAGNOSIS — I10 Essential (primary) hypertension: Secondary | ICD-10-CM | POA: Diagnosis not present

## 2023-12-19 DIAGNOSIS — J9501 Hemorrhage from tracheostomy stoma: Secondary | ICD-10-CM | POA: Diagnosis not present

## 2023-12-19 DIAGNOSIS — J9601 Acute respiratory failure with hypoxia: Secondary | ICD-10-CM | POA: Diagnosis not present

## 2023-12-19 DIAGNOSIS — J930 Spontaneous tension pneumothorax: Secondary | ICD-10-CM | POA: Diagnosis not present

## 2023-12-19 DIAGNOSIS — J189 Pneumonia, unspecified organism: Secondary | ICD-10-CM

## 2023-12-19 DIAGNOSIS — J9621 Acute and chronic respiratory failure with hypoxia: Secondary | ICD-10-CM | POA: Diagnosis not present

## 2023-12-19 DIAGNOSIS — Z930 Tracheostomy status: Secondary | ICD-10-CM

## 2023-12-19 DIAGNOSIS — R1312 Dysphagia, oropharyngeal phase: Secondary | ICD-10-CM | POA: Diagnosis not present

## 2023-12-20 DIAGNOSIS — Z930 Tracheostomy status: Secondary | ICD-10-CM | POA: Diagnosis not present

## 2023-12-20 DIAGNOSIS — J9601 Acute respiratory failure with hypoxia: Secondary | ICD-10-CM | POA: Diagnosis not present

## 2023-12-20 DIAGNOSIS — J930 Spontaneous tension pneumothorax: Secondary | ICD-10-CM | POA: Diagnosis not present

## 2023-12-20 DIAGNOSIS — J189 Pneumonia, unspecified organism: Secondary | ICD-10-CM | POA: Diagnosis not present

## 2023-12-20 DIAGNOSIS — I10 Essential (primary) hypertension: Secondary | ICD-10-CM | POA: Diagnosis not present

## 2023-12-20 DIAGNOSIS — J9501 Hemorrhage from tracheostomy stoma: Secondary | ICD-10-CM | POA: Diagnosis not present

## 2023-12-20 DIAGNOSIS — R1312 Dysphagia, oropharyngeal phase: Secondary | ICD-10-CM | POA: Diagnosis not present

## 2023-12-20 DIAGNOSIS — J9621 Acute and chronic respiratory failure with hypoxia: Secondary | ICD-10-CM | POA: Diagnosis not present

## 2023-12-21 DIAGNOSIS — J9621 Acute and chronic respiratory failure with hypoxia: Secondary | ICD-10-CM | POA: Diagnosis not present

## 2023-12-21 DIAGNOSIS — J930 Spontaneous tension pneumothorax: Secondary | ICD-10-CM | POA: Diagnosis not present

## 2023-12-21 DIAGNOSIS — Z930 Tracheostomy status: Secondary | ICD-10-CM | POA: Diagnosis not present

## 2023-12-21 DIAGNOSIS — J9601 Acute respiratory failure with hypoxia: Secondary | ICD-10-CM | POA: Diagnosis not present

## 2023-12-21 DIAGNOSIS — J9501 Hemorrhage from tracheostomy stoma: Secondary | ICD-10-CM | POA: Diagnosis not present

## 2023-12-21 DIAGNOSIS — I10 Essential (primary) hypertension: Secondary | ICD-10-CM | POA: Diagnosis not present

## 2023-12-21 DIAGNOSIS — J189 Pneumonia, unspecified organism: Secondary | ICD-10-CM | POA: Diagnosis not present

## 2023-12-21 DIAGNOSIS — R1312 Dysphagia, oropharyngeal phase: Secondary | ICD-10-CM | POA: Diagnosis not present

## 2023-12-22 DIAGNOSIS — J9501 Hemorrhage from tracheostomy stoma: Secondary | ICD-10-CM | POA: Diagnosis not present

## 2023-12-22 DIAGNOSIS — Z930 Tracheostomy status: Secondary | ICD-10-CM | POA: Diagnosis not present

## 2023-12-22 DIAGNOSIS — R1312 Dysphagia, oropharyngeal phase: Secondary | ICD-10-CM | POA: Diagnosis not present

## 2023-12-22 DIAGNOSIS — J9601 Acute respiratory failure with hypoxia: Secondary | ICD-10-CM | POA: Diagnosis not present

## 2023-12-22 DIAGNOSIS — I10 Essential (primary) hypertension: Secondary | ICD-10-CM | POA: Diagnosis not present

## 2023-12-22 DIAGNOSIS — J449 Chronic obstructive pulmonary disease, unspecified: Secondary | ICD-10-CM

## 2023-12-22 DIAGNOSIS — J189 Pneumonia, unspecified organism: Secondary | ICD-10-CM | POA: Diagnosis not present

## 2023-12-22 DIAGNOSIS — J930 Spontaneous tension pneumothorax: Secondary | ICD-10-CM | POA: Diagnosis not present

## 2023-12-22 DIAGNOSIS — J9621 Acute and chronic respiratory failure with hypoxia: Secondary | ICD-10-CM | POA: Diagnosis not present

## 2023-12-23 DIAGNOSIS — R1312 Dysphagia, oropharyngeal phase: Secondary | ICD-10-CM | POA: Diagnosis not present

## 2023-12-23 DIAGNOSIS — J9601 Acute respiratory failure with hypoxia: Secondary | ICD-10-CM | POA: Diagnosis not present

## 2023-12-23 DIAGNOSIS — I10 Essential (primary) hypertension: Secondary | ICD-10-CM | POA: Diagnosis not present

## 2023-12-23 DIAGNOSIS — J9501 Hemorrhage from tracheostomy stoma: Secondary | ICD-10-CM | POA: Diagnosis not present

## 2023-12-24 DIAGNOSIS — R1312 Dysphagia, oropharyngeal phase: Secondary | ICD-10-CM | POA: Diagnosis not present

## 2023-12-24 DIAGNOSIS — J449 Chronic obstructive pulmonary disease, unspecified: Secondary | ICD-10-CM

## 2023-12-24 DIAGNOSIS — I10 Essential (primary) hypertension: Secondary | ICD-10-CM | POA: Diagnosis not present

## 2023-12-24 DIAGNOSIS — J9501 Hemorrhage from tracheostomy stoma: Secondary | ICD-10-CM | POA: Diagnosis not present

## 2023-12-24 DIAGNOSIS — J9601 Acute respiratory failure with hypoxia: Secondary | ICD-10-CM | POA: Diagnosis not present

## 2023-12-25 DIAGNOSIS — J9601 Acute respiratory failure with hypoxia: Secondary | ICD-10-CM | POA: Diagnosis not present

## 2023-12-25 DIAGNOSIS — R1312 Dysphagia, oropharyngeal phase: Secondary | ICD-10-CM | POA: Diagnosis not present

## 2023-12-25 DIAGNOSIS — I10 Essential (primary) hypertension: Secondary | ICD-10-CM | POA: Diagnosis not present

## 2023-12-25 DIAGNOSIS — J9501 Hemorrhage from tracheostomy stoma: Secondary | ICD-10-CM | POA: Diagnosis not present

## 2023-12-26 DIAGNOSIS — J9601 Acute respiratory failure with hypoxia: Secondary | ICD-10-CM | POA: Diagnosis not present

## 2023-12-26 DIAGNOSIS — I10 Essential (primary) hypertension: Secondary | ICD-10-CM | POA: Diagnosis not present

## 2023-12-26 DIAGNOSIS — R1312 Dysphagia, oropharyngeal phase: Secondary | ICD-10-CM | POA: Diagnosis not present

## 2023-12-26 DIAGNOSIS — J9501 Hemorrhage from tracheostomy stoma: Secondary | ICD-10-CM | POA: Diagnosis not present

## 2023-12-27 DIAGNOSIS — I10 Essential (primary) hypertension: Secondary | ICD-10-CM | POA: Diagnosis not present

## 2023-12-27 DIAGNOSIS — J9601 Acute respiratory failure with hypoxia: Secondary | ICD-10-CM | POA: Diagnosis not present

## 2023-12-27 DIAGNOSIS — R1312 Dysphagia, oropharyngeal phase: Secondary | ICD-10-CM | POA: Diagnosis not present

## 2023-12-27 DIAGNOSIS — J9501 Hemorrhage from tracheostomy stoma: Secondary | ICD-10-CM | POA: Diagnosis not present

## 2023-12-28 DIAGNOSIS — I10 Essential (primary) hypertension: Secondary | ICD-10-CM | POA: Diagnosis not present

## 2023-12-28 DIAGNOSIS — J9601 Acute respiratory failure with hypoxia: Secondary | ICD-10-CM | POA: Diagnosis not present

## 2023-12-28 DIAGNOSIS — R1312 Dysphagia, oropharyngeal phase: Secondary | ICD-10-CM | POA: Diagnosis not present

## 2023-12-28 DIAGNOSIS — J9501 Hemorrhage from tracheostomy stoma: Secondary | ICD-10-CM | POA: Diagnosis not present

## 2023-12-29 DIAGNOSIS — R1312 Dysphagia, oropharyngeal phase: Secondary | ICD-10-CM | POA: Diagnosis not present

## 2023-12-29 DIAGNOSIS — J9601 Acute respiratory failure with hypoxia: Secondary | ICD-10-CM | POA: Diagnosis not present

## 2023-12-29 DIAGNOSIS — J9501 Hemorrhage from tracheostomy stoma: Secondary | ICD-10-CM | POA: Diagnosis not present

## 2023-12-29 DIAGNOSIS — I10 Essential (primary) hypertension: Secondary | ICD-10-CM | POA: Diagnosis not present

## 2023-12-30 DIAGNOSIS — J9601 Acute respiratory failure with hypoxia: Secondary | ICD-10-CM | POA: Diagnosis not present

## 2023-12-30 DIAGNOSIS — J9501 Hemorrhage from tracheostomy stoma: Secondary | ICD-10-CM | POA: Diagnosis not present

## 2023-12-30 DIAGNOSIS — I10 Essential (primary) hypertension: Secondary | ICD-10-CM | POA: Diagnosis not present

## 2023-12-30 DIAGNOSIS — R1312 Dysphagia, oropharyngeal phase: Secondary | ICD-10-CM | POA: Diagnosis not present

## 2023-12-31 DIAGNOSIS — J930 Spontaneous tension pneumothorax: Secondary | ICD-10-CM | POA: Diagnosis not present

## 2023-12-31 DIAGNOSIS — I10 Essential (primary) hypertension: Secondary | ICD-10-CM

## 2023-12-31 DIAGNOSIS — J189 Pneumonia, unspecified organism: Secondary | ICD-10-CM | POA: Diagnosis not present

## 2023-12-31 DIAGNOSIS — R1312 Dysphagia, oropharyngeal phase: Secondary | ICD-10-CM | POA: Diagnosis not present

## 2023-12-31 DIAGNOSIS — J9601 Acute respiratory failure with hypoxia: Secondary | ICD-10-CM | POA: Diagnosis not present

## 2023-12-31 DIAGNOSIS — Z930 Tracheostomy status: Secondary | ICD-10-CM | POA: Diagnosis not present

## 2023-12-31 DIAGNOSIS — J9621 Acute and chronic respiratory failure with hypoxia: Secondary | ICD-10-CM | POA: Diagnosis not present

## 2023-12-31 DIAGNOSIS — J9501 Hemorrhage from tracheostomy stoma: Secondary | ICD-10-CM

## 2023-12-31 DIAGNOSIS — J449 Chronic obstructive pulmonary disease, unspecified: Secondary | ICD-10-CM

## 2024-01-01 ENCOUNTER — Other Ambulatory Visit: Payer: Self-pay

## 2024-01-01 ENCOUNTER — Inpatient Hospital Stay (HOSPITAL_COMMUNITY)

## 2024-01-01 ENCOUNTER — Encounter (HOSPITAL_COMMUNITY): Payer: Self-pay

## 2024-01-01 ENCOUNTER — Emergency Department (HOSPITAL_COMMUNITY)

## 2024-01-01 ENCOUNTER — Inpatient Hospital Stay (HOSPITAL_COMMUNITY)
Admission: EM | Admit: 2024-01-01 | Discharge: 2024-01-05 | DRG: 201 | Disposition: A | Discharge status: Home Health | Source: Other Acute Inpatient Hospital | Attending: Student | Admitting: Student

## 2024-01-01 DIAGNOSIS — Z860100 Personal history of colon polyps, unspecified: Secondary | ICD-10-CM

## 2024-01-01 DIAGNOSIS — J939 Pneumothorax, unspecified: Principal | ICD-10-CM | POA: Insufficient documentation

## 2024-01-01 DIAGNOSIS — J438 Other emphysema: Secondary | ICD-10-CM | POA: Diagnosis present

## 2024-01-01 DIAGNOSIS — F17210 Nicotine dependence, cigarettes, uncomplicated: Secondary | ICD-10-CM | POA: Diagnosis present

## 2024-01-01 DIAGNOSIS — Z79899 Other long term (current) drug therapy: Secondary | ICD-10-CM

## 2024-01-01 DIAGNOSIS — J930 Spontaneous tension pneumothorax: Principal | ICD-10-CM | POA: Diagnosis present

## 2024-01-01 DIAGNOSIS — E7800 Pure hypercholesterolemia, unspecified: Secondary | ICD-10-CM | POA: Diagnosis present

## 2024-01-01 DIAGNOSIS — J432 Centrilobular emphysema: Secondary | ICD-10-CM | POA: Diagnosis present

## 2024-01-01 DIAGNOSIS — G4733 Obstructive sleep apnea (adult) (pediatric): Secondary | ICD-10-CM

## 2024-01-01 DIAGNOSIS — D72829 Elevated white blood cell count, unspecified: Secondary | ICD-10-CM

## 2024-01-01 DIAGNOSIS — Z886 Allergy status to analgesic agent status: Secondary | ICD-10-CM | POA: Diagnosis not present

## 2024-01-01 DIAGNOSIS — L89150 Pressure ulcer of sacral region, unstageable: Secondary | ICD-10-CM | POA: Diagnosis present

## 2024-01-01 DIAGNOSIS — K219 Gastro-esophageal reflux disease without esophagitis: Secondary | ICD-10-CM | POA: Diagnosis present

## 2024-01-01 DIAGNOSIS — J449 Chronic obstructive pulmonary disease, unspecified: Secondary | ICD-10-CM | POA: Diagnosis not present

## 2024-01-01 DIAGNOSIS — J45909 Unspecified asthma, uncomplicated: Secondary | ICD-10-CM

## 2024-01-01 DIAGNOSIS — J9501 Hemorrhage from tracheostomy stoma: Secondary | ICD-10-CM | POA: Diagnosis not present

## 2024-01-01 DIAGNOSIS — Z800 Family history of malignant neoplasm of digestive organs: Secondary | ICD-10-CM | POA: Diagnosis not present

## 2024-01-01 DIAGNOSIS — Z6839 Body mass index (BMI) 39.0-39.9, adult: Secondary | ICD-10-CM

## 2024-01-01 DIAGNOSIS — F32A Depression, unspecified: Secondary | ICD-10-CM | POA: Diagnosis present

## 2024-01-01 DIAGNOSIS — K589 Irritable bowel syndrome without diarrhea: Secondary | ICD-10-CM | POA: Insufficient documentation

## 2024-01-01 DIAGNOSIS — J4520 Mild intermittent asthma, uncomplicated: Secondary | ICD-10-CM | POA: Diagnosis not present

## 2024-01-01 DIAGNOSIS — K2100 Gastro-esophageal reflux disease with esophagitis, without bleeding: Secondary | ICD-10-CM | POA: Diagnosis not present

## 2024-01-01 DIAGNOSIS — Z801 Family history of malignant neoplasm of trachea, bronchus and lung: Secondary | ICD-10-CM

## 2024-01-01 DIAGNOSIS — E669 Obesity, unspecified: Secondary | ICD-10-CM | POA: Diagnosis present

## 2024-01-01 DIAGNOSIS — F909 Attention-deficit hyperactivity disorder, unspecified type: Secondary | ICD-10-CM | POA: Insufficient documentation

## 2024-01-01 DIAGNOSIS — G4700 Insomnia, unspecified: Secondary | ICD-10-CM | POA: Diagnosis present

## 2024-01-01 DIAGNOSIS — Z9049 Acquired absence of other specified parts of digestive tract: Secondary | ICD-10-CM

## 2024-01-01 DIAGNOSIS — Z7952 Long term (current) use of systemic steroids: Secondary | ICD-10-CM

## 2024-01-01 DIAGNOSIS — J9601 Acute respiratory failure with hypoxia: Secondary | ICD-10-CM | POA: Diagnosis not present

## 2024-01-01 DIAGNOSIS — F320 Major depressive disorder, single episode, mild: Secondary | ICD-10-CM

## 2024-01-01 DIAGNOSIS — L89302 Pressure ulcer of unspecified buttock, stage 2: Secondary | ICD-10-CM | POA: Diagnosis present

## 2024-01-01 DIAGNOSIS — J4489 Other specified chronic obstructive pulmonary disease: Secondary | ICD-10-CM | POA: Diagnosis present

## 2024-01-01 DIAGNOSIS — D72820 Lymphocytosis (symptomatic): Secondary | ICD-10-CM | POA: Diagnosis not present

## 2024-01-01 DIAGNOSIS — Z91013 Allergy to seafood: Secondary | ICD-10-CM

## 2024-01-01 DIAGNOSIS — J860 Pyothorax with fistula: Secondary | ICD-10-CM

## 2024-01-01 DIAGNOSIS — I10 Essential (primary) hypertension: Secondary | ICD-10-CM

## 2024-01-01 DIAGNOSIS — N400 Enlarged prostate without lower urinary tract symptoms: Secondary | ICD-10-CM | POA: Diagnosis present

## 2024-01-01 DIAGNOSIS — R1312 Dysphagia, oropharyngeal phase: Secondary | ICD-10-CM | POA: Diagnosis not present

## 2024-01-01 DIAGNOSIS — F411 Generalized anxiety disorder: Secondary | ICD-10-CM

## 2024-01-01 DIAGNOSIS — Z880 Allergy status to penicillin: Secondary | ICD-10-CM | POA: Diagnosis not present

## 2024-01-01 DIAGNOSIS — Z7951 Long term (current) use of inhaled steroids: Secondary | ICD-10-CM

## 2024-01-01 DIAGNOSIS — F410 Panic disorder [episodic paroxysmal anxiety] without agoraphobia: Secondary | ICD-10-CM | POA: Diagnosis present

## 2024-01-01 DIAGNOSIS — F5105 Insomnia due to other mental disorder: Secondary | ICD-10-CM | POA: Diagnosis present

## 2024-01-01 DIAGNOSIS — Z87442 Personal history of urinary calculi: Secondary | ICD-10-CM

## 2024-01-01 HISTORY — DX: Gastro-esophageal reflux disease without esophagitis: K21.9

## 2024-01-01 HISTORY — DX: Pneumothorax, unspecified: J93.9

## 2024-01-01 HISTORY — DX: Pyothorax with fistula: J86.0

## 2024-01-01 HISTORY — DX: Essential (primary) hypertension: I10

## 2024-01-01 HISTORY — DX: Spontaneous tension pneumothorax: J93.0

## 2024-01-01 HISTORY — DX: Irritable bowel syndrome, unspecified: K58.9

## 2024-01-01 HISTORY — DX: Elevated white blood cell count, unspecified: D72.829

## 2024-01-01 HISTORY — DX: Attention-deficit hyperactivity disorder, unspecified type: F90.9

## 2024-01-01 HISTORY — DX: Generalized anxiety disorder: F41.1

## 2024-01-01 HISTORY — DX: Obstructive sleep apnea (adult) (pediatric): G47.33

## 2024-01-01 HISTORY — DX: Unspecified asthma, uncomplicated: J45.909

## 2024-01-01 LAB — I-STAT VENOUS BLOOD GAS, ED
Acid-Base Excess: 0 mmol/L (ref 0.0–2.0)
Bicarbonate: 20.8 mmol/L (ref 20.0–28.0)
Calcium, Ion: 1.1 mmol/L — ABNORMAL LOW (ref 1.15–1.40)
HCT: 44 % (ref 39.0–52.0)
Hemoglobin: 15 g/dL (ref 13.0–17.0)
O2 Saturation: 63 %
Potassium: 3.9 mmol/L (ref 3.5–5.1)
Sodium: 138 mmol/L (ref 135–145)
TCO2: 22 mmol/L (ref 22–32)
pCO2, Ven: 23.9 mmHg — ABNORMAL LOW (ref 44–60)
pH, Ven: 7.548 — ABNORMAL HIGH (ref 7.25–7.43)
pO2, Ven: 27 mmHg — CL (ref 32–45)

## 2024-01-01 LAB — COMPREHENSIVE METABOLIC PANEL WITH GFR
ALT: 38 U/L (ref 0–44)
AST: 19 U/L (ref 15–41)
Albumin: 3.3 g/dL — ABNORMAL LOW (ref 3.5–5.0)
Alkaline Phosphatase: 113 U/L (ref 38–126)
Anion gap: 11 (ref 5–15)
BUN: 13 mg/dL (ref 6–20)
CO2: 21 mmol/L — ABNORMAL LOW (ref 22–32)
Calcium: 9.7 mg/dL (ref 8.9–10.3)
Chloride: 106 mmol/L (ref 98–111)
Creatinine, Ser: 0.89 mg/dL (ref 0.61–1.24)
GFR, Estimated: >60 mL/min (ref >60)
Glucose, Bld: 95 mg/dL (ref 70–99)
Potassium: 3.9 mmol/L (ref 3.5–5.1)
Sodium: 138 mmol/L (ref 135–145)
Total Bilirubin: 0.7 mg/dL (ref 0.0–1.2)
Total Protein: 7.6 g/dL (ref 6.5–8.1)

## 2024-01-01 LAB — CBC WITH DIFFERENTIAL/PLATELET
Abs Immature Granulocytes: 0.11 10*3/uL — ABNORMAL HIGH (ref 0.00–0.07)
Basophils Absolute: 0 10*3/uL (ref 0.0–0.1)
Basophils Relative: 0 %
Eosinophils Absolute: 0.1 10*3/uL (ref 0.0–0.5)
Eosinophils Relative: 1 %
HCT: 45.2 % (ref 39.0–52.0)
Hemoglobin: 14.6 g/dL (ref 13.0–17.0)
Immature Granulocytes: 1 %
Lymphocytes Relative: 22 %
Lymphs Abs: 2.9 10*3/uL (ref 0.7–4.0)
MCH: 29.7 pg (ref 26.0–34.0)
MCHC: 32.3 g/dL (ref 30.0–36.0)
MCV: 92.1 fL (ref 80.0–100.0)
Monocytes Absolute: 0.9 10*3/uL (ref 0.1–1.0)
Monocytes Relative: 7 %
Neutro Abs: 9 10*3/uL — ABNORMAL HIGH (ref 1.7–7.7)
Neutrophils Relative %: 69 %
Platelets: 351 10*3/uL (ref 150–400)
RBC: 4.91 MIL/uL (ref 4.22–5.81)
RDW: 12.7 % (ref 11.5–15.5)
WBC: 13.1 10*3/uL — ABNORMAL HIGH (ref 4.0–10.5)
nRBC: 0 % (ref 0.0–0.2)

## 2024-01-01 LAB — PROTIME-INR
INR: 1 (ref 0.8–1.2)
Prothrombin Time: 13 s (ref 11.4–15.2)

## 2024-01-01 LAB — APTT: aPTT: 30 s (ref 24–36)

## 2024-01-01 MED ORDER — LEVALBUTEROL HCL 0.63 MG/3ML IN NEBU
0.6300 mg | INHALATION_SOLUTION | Freq: Four times a day (QID) | RESPIRATORY_TRACT | Status: DC | PRN
  Filled 2024-01-01: qty 3

## 2024-01-01 MED ORDER — MORPHINE SULFATE (PF) 2 MG/ML IV SOLN
1.0000 mg | INTRAVENOUS | Status: DC | PRN
  Administered 2024-01-01: 1 mg via INTRAVENOUS
  Filled 2024-01-01: qty 1

## 2024-01-01 MED ORDER — SODIUM CHLORIDE 0.9% FLUSH
3.0000 mL | Freq: Two times a day (BID) | INTRAVENOUS | Status: DC
  Administered 2024-01-01 – 2024-01-05 (×7): 3 mL via INTRAVENOUS

## 2024-01-01 MED ORDER — AMPHETAMINE-DEXTROAMPHETAMINE 10 MG PO TABS
10.0000 mg | ORAL_TABLET | Freq: Every day | ORAL | Status: DC
  Filled 2024-01-01: qty 1

## 2024-01-01 MED ORDER — TRAZODONE HCL 50 MG PO TABS
50.0000 mg | ORAL_TABLET | Freq: Every evening | ORAL | Status: DC | PRN
  Administered 2024-01-02 – 2024-01-04 (×3): 50 mg via ORAL
  Filled 2024-01-01 (×3): qty 1

## 2024-01-01 MED ORDER — BUPROPION HCL ER (XL) 150 MG PO TB24
150.0000 mg | ORAL_TABLET | Freq: Every day | ORAL | Status: DC
  Administered 2024-01-01 – 2024-01-02 (×2): 150 mg via ORAL
  Filled 2024-01-01 (×2): qty 1

## 2024-01-01 MED ORDER — GUAIFENESIN ER 600 MG PO TB12
600.0000 mg | ORAL_TABLET | Freq: Two times a day (BID) | ORAL | Status: DC
  Administered 2024-01-01 – 2024-01-05 (×8): 600 mg via ORAL
  Filled 2024-01-01 (×8): qty 1

## 2024-01-01 MED ORDER — ACETAMINOPHEN 650 MG RE SUPP: 650.0000 mg | Freq: Four times a day (QID) | RECTAL | Status: DC | PRN

## 2024-01-01 MED ORDER — ONDANSETRON HCL 4 MG/2ML IJ SOLN
4.0000 mg | Freq: Four times a day (QID) | INTRAMUSCULAR | Status: DC | PRN
  Administered 2024-01-02: 4 mg via INTRAVENOUS
  Filled 2024-01-01 (×2): qty 2

## 2024-01-01 MED ORDER — CLONAZEPAM 0.5 MG PO TABS
0.5000 mg | ORAL_TABLET | Freq: Two times a day (BID) | ORAL | Status: DC
  Administered 2024-01-01 – 2024-01-05 (×8): 0.5 mg via ORAL
  Filled 2024-01-01 (×8): qty 1

## 2024-01-01 MED ORDER — MOMETASONE FURO-FORMOTEROL FUM 200-5 MCG/ACT IN AERO
2.0000 | INHALATION_SPRAY | Freq: Two times a day (BID) | RESPIRATORY_TRACT | Status: DC
  Administered 2024-01-02 – 2024-01-05 (×6): 2 via RESPIRATORY_TRACT
  Filled 2024-01-01 (×2): qty 8.8

## 2024-01-01 MED ORDER — AMPHETAMINE-DEXTROAMPHETAMINE 10 MG PO TABS
10.0000 mg | ORAL_TABLET | Freq: Two times a day (BID) | ORAL | Status: DC
  Administered 2024-01-02 – 2024-01-05 (×6): 10 mg via ORAL
  Filled 2024-01-01 (×6): qty 1

## 2024-01-01 MED ORDER — LORAZEPAM 2 MG/ML IJ SOLN
1.0000 mg | Freq: Once | INTRAMUSCULAR | Status: AC
  Administered 2024-01-01: 1 mg via INTRAVENOUS
  Filled 2024-01-01: qty 1

## 2024-01-01 MED ORDER — ACETAMINOPHEN 325 MG PO TABS: 650.0000 mg | ORAL_TABLET | Freq: Four times a day (QID) | ORAL | Status: DC | PRN

## 2024-01-01 MED ORDER — ONDANSETRON HCL 4 MG PO TABS: 4.0000 mg | ORAL_TABLET | Freq: Four times a day (QID) | ORAL | Status: DC | PRN

## 2024-01-01 MED ORDER — LACTATED RINGERS IV SOLN: INTRAVENOUS | Status: AC

## 2024-01-01 NOTE — ED Notes (Signed)
 Called and placed PT on monitor wit CCMD

## 2024-01-01 NOTE — ED Notes (Signed)
 PT has a quarter size wound in the crevice of his buttocks

## 2024-01-01 NOTE — H&P (Incomplete)
 History and Physical    Robert Erickson WNU:[id number] DOB: 09-11-1971 DOA: 01/01/2024  PCP: Patient, No Pcp Per   Patient coming from: [facility]   Chief Complaint:  Chief Complaint  Patient presents with   Chest Injury   ED TRIAGE note:PT with a extensive respiratory HX BIB [facility] from [facility]. PT has a pneumothorax. A=OX 4, O2 stats are  93-96% RA. PT dose have high anxiety which is contributing to him running high sinus-ST.PT is also c/o pain from bedsores from the facility he came from   HPI:  Robert Erickson is a 53 y.o. 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  Patient has been transferred from [facility].  The chest tube has been removed today morning at the [facility] and patient found to have recurrent pneumothorax so he has been sent to the ED for cardiothoracic/pulmonary evaluation.  Patient reported that he developed pneumonia 1 month ago and being admitted at [facility] however could not recall any details about this hospitalization except he remembers he woke up at [facility] with a tracheostomy and chest tube.  Eventually trach has been decannulated and it has been healing well.  At [facility] patient redeveloped pneumothorax second time (unknown timeline) and chest tube was placed has eventually removed today 01/01/2023 and excepted plan to discharge to home tomorrow.  Since the removal of the chest tube patient started feeling short of breath, left-sided chest wall pain and very anxious.  Had a x-ray which showed development of pneumothorax-this is the third time.  During my evaluation at the bedside patient found very anxious, jittery, tearful and having  tremor of the bilateral upper extremity.  Stating that at [facility] he has not been taking care of well, not getting medication on time, no one has been cleaning him and he does not want to go back to LTAC at [facility].  Patient is complaining about left-sided chest wall pain 10 out of 10 intensity.  Denies any shortness of breath, palpitation, headache, fever, chill, cough or cough, nausea, vomiting, abdomen pain, constipation and diarrhea. He is stating that he wants to go to a different LTAC and not ready to go home yet.   ED Course:  Presentation to ED patient found borderline hypotensive blood pressure 118/99, tachypneic 30 which has been dropped to 14, O2 sat 98 to 93% on room air. VBG showing pH 7.5, pCO2 23, PO227 bicarb 20. CMP unremarkable. CBC showing leukocytosis 13.1. Normal pro time INR. EKG showing sinus tachycardia heart rate 92.  Chest x-ray showing recurrent and increased left-sided pneumothorax as compared to prior exam.  Hospitalist has been consulted for the management of pneumothorax.   ED provider has been consulted thoracic surgeon Dr.Bartle who is busy with another patient in the OR and will call back soon.   Update, EDP discussed case with PCCM Dr. Sherryll Burger recommended admit patient under hospitalist service.  Given patient is hemodynamically stable and O2 sat 93% so at this point there is no need for ICU level of care however ICU will be available for further assistance if complication arises..  Recommended to follow-up with cardiothoracic surgery and obtain CT chest for the loculation.    Review of Systems:  Review of Systems  Constitutional:  Negative for chills, fever, malaise/fatigue and weight loss.  Respiratory:  Negative for cough, sputum production, shortness of breath and wheezing.   Cardiovascular:  Positive for chest pain and palpitations. Negative for orthopnea and leg swelling.  Gastrointestinal:  Negative for heartburn, nausea and vomiting.  Musculoskeletal:   Negative for back pain, falls, joint pain, myalgias and neck pain.  Neurological:  Positive for tremors. Negative for dizziness, focal weakness, seizures, loss of consciousness, weakness and headaches.  Psychiatric/Behavioral:  Positive for depression. Negative for hallucinations. The patient is nervous/anxious.     Past Medical History:  Diagnosis Date   Anxiety    Arthritis    Asthma    Depression    Elevated cholesterol    Hypertension    Kidney stone    Obesity    Sleep apnea    does have a cpap machine    Past Surgical History:  Procedure Laterality Date   CHOLECYSTECTOMY     COLONOSCOPY  01/17/2007   Diminuitive colonic polyp status post polypectomy. Minimal sigmoid diverticulosis. Incidental note of small diverticulum in the TI.   TONSILLECTOMY       reports that he has been smoking cigarettes. He has never used smokeless tobacco. He reports current alcohol use. He reports that he does not currently use drugs after having used the following drugs: Marijuana.  Allergies  Allergen Reactions   Shellfish Allergy Anaphylaxis   Aspirin Rash   Penicillin G Rash    Family History  Problem Relation Age of Onset   Pancreatic cancer Mother    Lung cancer Maternal Grandfather    Colon cancer Neg Hx    Esophageal cancer Neg Hx    Stomach cancer Neg Hx    Rectal cancer Neg Hx     Prior to Admission medications   Medication Sig Start Date End Date Taking? Authorizing Provider  albuterol (VENTOLIN HFA) 108 (90 Base) MCG/ACT inhaler 2 puffs 4 (four) times daily as needed. 05/27/09   [provider]  amphetamine-dextroamphetamine (ADDERALL) 10 MG tablet 1 tablet daily. 05/07/19   [provider]  azelastine (ASTELIN) 0.1 % nasal spray Place 1 spray into the nose daily.    [provider]  budesonide-formoterol (SYMBICORT) 160-4.5 MCG/ACT inhaler Inhale 2 puffs into the lungs 2 (two) times daily.    [provider]  citalopram (CELEXA) 20 MG  tablet Take 20 mg by mouth daily.    [provider]  clonazePAM (KLONOPIN) 0.5 MG tablet Take 0.5 mg by mouth as directed. Take 1 in the morning and 2 in the afternoon    [provider]  diclofenac sodium (VOLTAREN) 1 % GEL Apply topically as needed.    [provider]  dicyclomine (BENTYL) 10 MG capsule Take 10 mg by mouth 2 (two) times daily.    [provider]  fluconazole (DIFLUCAN) 100 MG tablet Take 1 tablet (100 mg total) by mouth daily. Take Diflucan 200 mg for first day only, than 100 mg once a day for 14 days. 08/25/19   Lajuan Pila, MD  fluticasone (FLONASE) 50 MCG/ACT nasal spray Place 2 sprays into both nostrils daily.    [provider]  loratadine (CLARITIN) 10 MG tablet Take 10 mg by mouth daily.    [provider]  Multiple Vitamin (MULTIVITAMIN) tablet Take 1 tablet by mouth daily.    [provider]  omeprazole (PRILOSEC) 20 MG capsule Take 20 mg by mouth 2 (two) times daily.    [provider]  phenylephrine (SUDAFED PE) 10 MG TABS tablet Take 10  mg by mouth every 4 (four) hours as needed.    [provider]  tamsulosin (FLOMAX) 0.4 MG CAPS capsule Take 0.4 mg by mouth daily.    [provider]  TESTOSTERONE CYPIONATE IJ Inject 1 mL as directed as directed. Every other week    [provider]     Physical Exam: Vitals:   01/01/24 2030 01/01/24 2045 01/01/24 2100 01/02/24 0015  BP: 118/85 (!) 118/90 (!) 117/92 (!) 120/91  Pulse: 91 95 86 80  Resp: 16 15 14  (!) 23  Temp:    98.8 F (37.1 C)  TempSrc:    Oral  SpO2: 95% 93% 93% 92%  Weight:      Height:        Physical Exam Vitals and nursing note reviewed.  Constitutional:      Appearance: He is ill-appearing.  HENT:     Mouth/Throat:     Mouth: Mucous membranes are moist.  Eyes:     Pupils: Pupils are equal, round, and reactive to light.  Cardiovascular:     Rate and Rhythm: Regular rhythm. Tachycardia  present.     Pulses: Normal pulses.     Heart sounds: Normal heart sounds.  Pulmonary:     Effort: Pulmonary effort is normal.     Breath sounds: Normal breath sounds. No wheezing or rales.  Chest:     Chest wall: Tenderness present.  Abdominal:     Palpations: Abdomen is soft.  Musculoskeletal:     Cervical back: Neck supple.     Right lower leg: No edema.     Left lower leg: No edema.  Skin:    General: Skin is dry.     Capillary Refill: Capillary refill takes less than 2 seconds.  Neurological:     Mental Status: He is alert and oriented to person, place, and time.  Psychiatric:        Attention and Perception: Attention normal.        Mood and Affect: Mood is anxious. Affect is labile and tearful.        Speech: Speech normal.        Behavior: Behavior is hyperactive. Behavior is not agitated, slowed, aggressive, withdrawn or combative. Behavior is cooperative.        Thought Content: Thought content normal.        Cognition and Memory: Cognition normal.        Judgment: Judgment normal.      Labs on Admission: I have personally reviewed following labs and imaging studies  CBC: Recent Labs  Lab 01/01/24 1940 01/01/24 1942  WBC 13.1*  --   NEUTROABS 9.0*  --   HGB 14.6 15.0  HCT 45.2 44.0  MCV 92.1  --   PLT 351  --    Basic Metabolic Panel: Recent Labs  Lab 01/01/24 1940 01/01/24 1942  NA 138 138  K 3.9 3.9  CL 106  --   CO2 21*  --   GLUCOSE 95  --   BUN 13  --   CREATININE 0.89  --   CALCIUM 9.7  --    GFR: Estimated Creatinine Clearance: 124.6 mL/min (by C-G formula based on SCr of 0.89 mg/dL). Liver Function Tests: Recent Labs  Lab 01/01/24 1940  AST 19  ALT 38  ALKPHOS 113  BILITOT 0.7  PROT 7.6  ALBUMIN 3.3*   No results for input(s): "LIPASE", "AMYLASE" in the last 168 hours. No results for input(s): "AMMONIA" in the last  168 hours. Coagulation Profile: Recent Labs  Lab 01/01/24 1940  INR 1.0   Cardiac Enzymes: No results for  input(s): "CKTOTAL", "CKMB", "CKMBINDEX", "TROPONINI", "TROPONINIHS" in the last 168 hours. BNP (last 3 results) No results for input(s): "BNP" in the last 8760 hours. HbA1C: No results for input(s): "HGBA1C" in the last 72 hours. CBG: No results for input(s): "GLUCAP" in the last 168 hours. Lipid Profile: No results for input(s): "CHOL", "HDL", "LDLCALC", "TRIG", "CHOLHDL", "LDLDIRECT" in the last 72 hours. Thyroid Function Tests: No results for input(s): "TSH", "T4TOTAL", "FREET4", "T3FREE", "THYROIDAB" in the last 72 hours. Anemia Panel: No results for input(s): "VITAMINB12", "FOLATE", "FERRITIN", "TIBC", "IRON", "RETICCTPCT" in the last 72 hours. Urine analysis: No results found for: "COLORURINE", "APPEARANCEUR", "LABSPEC", "PHURINE", "GLUCOSEU", "HGBUR", "BILIRUBINUR", "KETONESUR", "PROTEINUR", "UROBILINOGEN", "NITRITE", "LEUKOCYTESUR"  Radiological Exams on Admission: I have personally reviewed images CT CHEST WO CONTRAST Result Date: 01/01/2024 CLINICAL DATA:  Chest injury, pneumothorax EXAM: CT CHEST WITHOUT CONTRAST TECHNIQUE: Multidetector CT imaging of the chest was performed following the standard protocol without IV contrast. RADIATION DOSE REDUCTION: This exam was performed according to the departmental dose-optimization program which includes automated exposure control, adjustment of the mA and/or kV according to patient size and/or use of iterative reconstruction technique. COMPARISON:  CT 11/30/2023.  Chest x-ray today. FINDINGS: Cardiovascular: Heart is normal size. Aorta is normal caliber. Mediastinum/Nodes: No mediastinal, hilar, or axillary adenopathy. Small amount of pneumomediastinum noted in the anterior superior mediastinum. Trachea and esophagus are unremarkable. Thyroid unremarkable. Stoma noted in the lower neck. Lungs/Pleura: Moderate to severe centrilobular and paraseptal emphysema. Bulla in the apices bilaterally. Subsegmental atelectasis in the lower lobes  bilaterally. Small to moderate left pneumothorax noted, likely approximately 15-20%. This is slightly smaller than the pneumothorax seen on CT from 11/30/2023. No effusions. Upper Abdomen: No acute findings Musculoskeletal: Left subcutaneous emphysema. No acute bony abnormality. IMPRESSION: Recurrent 15-20% left pneumothorax. This is slightly smaller than the pneumothorax seen on prior CT from 11/30/2023. Small amount of pneumomediastinum noted. Moderate to advanced centrilobular and paraseptal emphysema with upper lobe bulla bilaterally. Electronically Signed   By: Janeece Mechanic M.D.   On: 01/01/2024 23:28   DG Chest Portable 1 View Result Date: 01/01/2024 CLINICAL DATA:  Follow-up left pneumothorax EXAM: PORTABLE CHEST 1 VIEW COMPARISON:  12/01/2023 FINDINGS: Pigtail catheter has been removed on the left. There is a recurrent pneumothorax identified along the apex and laterally on the left. Right lung remains clear. Cardiac shadow is within normal limits. No bony abnormality is seen. IMPRESSION: Recurrent and increased left-sided pneumothorax when compared with the prior exam. Critical Value/emergent results were called by telephone at the time of interpretation on 01/01/2024 at 8:50 pm to Dr. Annita Kindle , who verbally acknowledged these results. Electronically Signed   By: Violeta Grey M.D.   On: 01/01/2024 20:52     EKG: My personal interpretation of EKG shows: Sinus tachycardia heart rate 90.    Assessment/Plan: Principal Problem:   Pneumothorax, left Active Problems:   Bronchopleural fistula (HCC)   Essential hypertension   ADHD   Generalized anxiety disorder   IBS (irritable bowel syndrome)   Depression   GERD (gastroesophageal reflux disease)   Asthma, chronic   Obstructive sleep apnea   Leukocytosis   Tension pneumothorax    Assessment and Plan: Tension pneumothorax History of recurrent pneumothorax History of tracheostomy s/p decannulation -Patient has been transferred from  [facility] to [facility] ED for evaluation for development of recurrent pneumothorax.  Patient was being  admitted on [facility] secondary to tension pneumothorax was on ventilator afterward had tracheostomy followed by decannulation.  During that time when the chest tube has been removed patient redeveloped pneumothorax likely secondary from bronchopleural fistula.  He was discharged to [facility] and the chest tube has been removed today 4/15.  Again patient has been developed tension pneumothorax. - At presentation to ED hemodynamically stable.  However patient is very anxious and hyperactive.  O2 sat 93 to 98% on room air. - Afebrile.  CBC showing mild leukocytosis. - Chest x-ray no evidence of pneumonia however it is showing Recurrent and increased left-sided pneumothorax when compared with the prior exam. -Cardiothoracic surgeon Dr. Randal Bury has been consulted pending recommendation. -PCCM Dr. Mason Sole has been consulted given patient is hemodynamically stable patient can be managed at progressive unit.  And ICU will be available for any assistance in case of complication arises.  Recommended to obtain CT chest to loculation of the pneumothorax. -In the setting of recurrent pneumothorax and development of bronchopleural fistula patient probably chest tube placement versus endoscopic management-mechanical aberration/polidocanol, and cyanoacrylate glue.  -CT chest showing recurrent 15 to 20% left pneumothorax which is slightly smaller than previous pneumothorax from prior CT 11/30/2023. -Currently patient is hemodynamically stable.  Admitting to progressive unit. -Goal to keep O2 sat above 92%.  Continue [place] oxygen 1 to 2 L as needed.  However in the setting of tension pneumothorax need to avoid excessive oxygenation. -Continue pain control, Dulera and DuoNeb as needed. - Monitor vitals, check telemetry. -Keeping patient n.p.o. starting maintenance fluid. -Deferring any pharmacological DVT prophylaxis in  the setting of tension pneumothorax-high risk of bleeding. Addendum - Received a call from the OR Dr. Sherene Dilling still working on an endovascular repair case which might take 5 AM to finish.  Discussed situation with PCCM Dr. Mason Sole.  Given patient is hemodynamically stable recommended conservative management. - Consulting IR for chest tube placement.  Reactive leukocytosis -WBC count 13.  Reactive leukocytosis in the setting of tension pneumothorax.  No evidence of pneumonia at this time.  Continue to monitor clinically  Essential hypertension -Currently not any blood pressure regimen.  Generalized anxiety disorder Depression Insomnia -Patient is very anxious and hyperactive.  Per chart review patient has history of ADHD, generalized anxiety disorder depression. - Starting Wellbutrin 150 mg daily, continue Klonopin 0.5 mg twice daily. -Continue trazodone at bedtime as needed -Patient is tearful, jittery, anxious and having bilateral upper extremity tremor.  He is tearful because he does not want to go back to [facility] as complaining about this place has not been taking care of him well, not giving food on time, not giving medication on time and not cleaning or helping him with bed.  Requesting for different facility placement. - Consulting psychiatry to assess underlying depression and anxiety and medication readjustment. - Consulting case management for different facility placement if it is possible.  ADHD - Continue Adderall 10 mg twice daily.   DVT prophylaxis:  SCDs Code Status:  Full Code Diet: Heart healthy diet Family Communication: Currently no family member at bedside Disposition Plan: Waiting for formal CT surgery recommendation. Consults: Cardiothoracic surgery and PCCM.  Case management and psychiatry. Admission status:   Inpatient, Step Down Unit  Severity of Illness: The appropriate patient status for this patient is INPATIENT. Inpatient status is judged to be  reasonable and necessary in order to provide the required intensity of service to ensure the patient's safety. The patient's presenting symptoms, physical exam findings, and initial  radiographic and laboratory data in the context of their chronic comorbidities is felt to place them at high risk for further clinical deterioration. Furthermore, it is not anticipated that the patient will be medically stable for discharge from the hospital within 2 midnights of admission.   * I certify that at the point of admission it is my clinical judgment that the patient will require inpatient hospital care spanning beyond 2 midnights from the point of admission due to high intensity of service, high risk for further deterioration and high frequency of surveillance required.Aaron Aas    Kaoru Benda, MD [facility]  How to contact the [facility] Attending or Consulting provider 7A - 7P or covering provider during after hours 7P -7A, for this patient.  Check the care team in [facility] and look for a) attending/consulting [facility] provider listed and b) the [facility] team listed Log into [email or web address] and use 's universal password to access. If you do not have the password, please contact the hospital operator. Locate the [facility] provider you are looking for under [facility] and page to a number that you can be directly reached. If you still have difficulty reaching the provider, please page the [facility] (Director on Call) for the Hospitalists listed on amion for assistance.  01/02/2024, 2:31 AM

## 2024-01-01 NOTE — ED Triage Notes (Addendum)
 PT with a extensive respiratory HX BIB [facility] from [facility]. PT has a pneumothorax. A=OX 4, O2 stats are  93-96% RA. PT dose have high anxiety which is contributing to him running high sinus-ST.PT is also c/o pain from bedsores from the facility he came from

## 2024-01-01 NOTE — Consult Note (Signed)
 NAME:  Robert Erickson, MRN:  [id number], DOB:  05-Mar-1971, LOS: 0 ADMISSION DATE:  01/01/2024, CONSULTATION DATE:  01/01/2024 REFERRING MD:  Cheron Schaumann, PA-C, CHIEF COMPLAINT:  Pneumonthorax   History of Present Illness:  53 y/o male with PMH for IBS, GERD, HCL, Depression, Asthma, renal stone and severe anxiety who apparently had pneumonia about 1 month ago and then eventually was d/c to [facility] and had a trach and at some point had a PTX requiring chest tube.  The chest tube was removed today in preparation for him to be d/c home.  However, post chest tube, he re-developed a left sided PTX prompting him to be sent to [facility]. Cxr at [facility] confirming a moderate sized left PTX.  Patient's vital otherwise stable as is patient despite his significant anxiety.    Pertinent  Medical History  S/p Trach S/p Left chest tube Anxiety/Depression IBD GERD Pneumonia Significant Hospital Events: Including procedures, antibiotic start and stop dates in addition to other pertinent events   4/15 Admit for PTX  Interim History / Subjective:  N/a  Objective   Blood pressure (!) 117/92, pulse 86, temperature 98 F (36.7 C), temperature source Oral, resp. rate 14, height 5\' 9"  (1.753 m), weight 120.7 kg, SpO2 93%.       No intake or output data in the 24 hours ending 01/01/24 2309 Filed Weights   01/01/24 1924  Weight: 120.7 kg    Examination: General: anxious but in NAD respiratory distress on RA, c/o soreness in left side previous chest tube site HENT: PERRLA no icterus, EOMI, trach stoma almost closed Lungs: CTA b/l no wheezes, left side good BS as well, no rhonchi no rales Cardiovascular: reg s1s2 no murmurs or gallops Abdomen: soft nt nd bs pos no guarding Extremities: no cyanosis, clubbing or edema Neuro: AAOx3 CN II-Xii Grossly intact   Resolved Hospital Problem list   N/a  Assessment & Plan:  Left PTX The patient currently on RA, CT chest pending, not SOB and chest pain at site of  old Chest tube.  He has good air entry b/l.  CT surgery to see patient later today.  Will follow their recommendations.  No indication for ICU admission or monitoring, He can be safely admitted to monitored floor. O2 as needed.  Anxiolytics and pain meds as indicated Pulmonary will follow the patient.  Please consult Critical Care if patient's status changes or deteriorates.  Best Practice (right click and "Reselect all SmartList Selections" daily)   Diet/type: NPO w/ oral meds DVT prophylaxis other-hold until CT surgery gives recommendations, if no OR then start DVT prophylaxis Pressure ulcer(s): present on admission  GI prophylaxis: H2B Lines: N/A Foley:  N/A Code Status:  full code   Labs   CBC: Recent Labs  Lab 01/01/24 1940 01/01/24 1942  WBC 13.1*  --   NEUTROABS 9.0*  --   HGB 14.6 15.0  HCT 45.2 44.0  MCV 92.1  --   PLT 351  --     Basic Metabolic Panel: Recent Labs  Lab 01/01/24 1940 01/01/24 1942  NA 138 138  K 3.9 3.9  CL 106  --   CO2 21*  --   GLUCOSE 95  --   BUN 13  --   CREATININE 0.89  --   CALCIUM 9.7  --    GFR: Estimated Creatinine Clearance: 124.6 mL/min (by C-G formula based on SCr of 0.89 mg/dL). Recent Labs  Lab 01/01/24 1940  WBC 13.1*    Liver  Function Tests: Recent Labs  Lab 01/01/24 1940  AST 19  ALT 38  ALKPHOS 113  BILITOT 0.7  PROT 7.6  ALBUMIN 3.3*   No results for input(s): "LIPASE", "AMYLASE" in the last 168 hours. No results for input(s): "AMMONIA" in the last 168 hours.  ABG    Component Value Date/Time   HCO3 20.8 01/01/2024 1942   TCO2 22 01/01/2024 1942   O2SAT 63 01/01/2024 1942     Coagulation Profile: Recent Labs  Lab 01/01/24 1940  INR 1.0    Cardiac Enzymes: No results for input(s): "CKTOTAL", "CKMB", "CKMBINDEX", "TROPONINI" in the last 168 hours.  HbA1C: No results found for: "HGBA1C"  CBG: No results for input(s): "GLUCAP" in the last 168 hours.  Review of Systems:   Left sided  chest pain  Past Medical History:  He,  has a past medical history of Anxiety, Arthritis, Asthma, Depression, Elevated cholesterol, Hypertension, Kidney stone, Obesity, and Sleep apnea.   Surgical History:   Past Surgical History:  Procedure Laterality Date   CHOLECYSTECTOMY     COLONOSCOPY  01/17/2007   Diminuitive colonic polyp status post polypectomy. Minimal sigmoid diverticulosis. Incidental note of small diverticulum in the TI.   TONSILLECTOMY       Social History:   reports that he has been smoking cigarettes. He has never used smokeless tobacco. He reports current alcohol use. He reports that he does not currently use drugs after having used the following drugs: Marijuana.   Family History:  His family history includes Lung cancer in his maternal grandfather; Pancreatic cancer in his mother. There is no history of Colon cancer, Esophageal cancer, Stomach cancer, or Rectal cancer.   Allergies Allergies  Allergen Reactions   Shellfish Allergy Anaphylaxis   Aspirin Rash   Penicillin G Rash     Home Medications  Prior to Admission medications   Medication Sig Start Date End Date Taking? Authorizing Provider  albuterol (VENTOLIN HFA) 108 (90 Base) MCG/ACT inhaler 2 puffs 4 (four) times daily as needed. 05/27/09   [provider]  amphetamine-dextroamphetamine (ADDERALL) 10 MG tablet 1 tablet daily. 05/07/19   [provider]  azelastine (ASTELIN) 0.1 % nasal spray Place 1 spray into the nose daily.    [provider]  budesonide-formoterol (SYMBICORT) 160-4.5 MCG/ACT inhaler Inhale 2 puffs into the lungs 2 (two) times daily.    [provider]  citalopram (CELEXA) 20 MG tablet Take 20 mg by mouth daily.    [provider]  clonazePAM (KLONOPIN) 0.5 MG tablet Take 0.5 mg by mouth as directed. Take 1 in the morning and 2 in the afternoon    [provider]  diclofenac sodium (VOLTAREN) 1 % GEL Apply topically as needed.     [provider]  dicyclomine (BENTYL) 10 MG capsule Take 10 mg by mouth 2 (two) times daily.    [provider]  fluconazole (DIFLUCAN) 100 MG tablet Take 1 tablet (100 mg total) by mouth daily. Take Diflucan 200 mg for first day only, than 100 mg once a day for 14 days. 08/25/19   Lajuan Pila, MD  fluticasone (FLONASE) 50 MCG/ACT nasal spray Place 2 sprays into both nostrils daily.    [provider]  loratadine (CLARITIN) 10 MG tablet Take 10 mg by mouth daily.    [provider]  Multiple Vitamin (MULTIVITAMIN) tablet Take 1 tablet by mouth daily.    [provider]  omeprazole (PRILOSEC) 20 MG capsule Take 20 mg by mouth  2 (two) times daily.    [provider]  phenylephrine (SUDAFED PE) 10 MG TABS tablet Take 10 mg by mouth every 4 (four) hours as needed.    [provider]  tamsulosin (FLOMAX) 0.4 MG CAPS capsule Take 0.4 mg by mouth daily.    [provider]  TESTOSTERONE CYPIONATE IJ Inject 1 mL as directed as directed. Every other week    [provider]

## 2024-01-01 NOTE — ED Provider Notes (Cosign Needed Addendum)
 [place] EMERGENCY DEPARTMENT AT [facility] Provider Note   CSN: [id number] Arrival date & time: 01/01/24  1921     History  Chief Complaint  Patient presents with   Chest Injury    Robert Erickson is a 53 y.o. male.  Patient reports that 1 month ago he developed pneumonia.  Patient states he was seen at [facility].  Patient has no recall of his hospitalization there.  Patient states the first thing he remembers is waking up at [facility] with a trach and a chest tube.  Patient reports he has been doing well at [facility].  He reports he has been able to walk 1000 steps.  Patient states he is scheduled to go home tomorrow.  Patient reports he tolerated having trach removed and is breathing well on his own.  He states his trach site has almost healed.  Patient reports that they removed the chest tube.  He states he feels like he is breathing okay currently.  Patient complains of severe anxiety.  Patient is very upset of being at the hospital.  He is very scared of what is going to happen to him patient has a past medical history of hypertension, anxiety  The history is provided by the patient. No language interpreter was used.       Home Medications Prior to Admission medications   Medication Sig Start Date End Date Taking? Authorizing Provider  albuterol (VENTOLIN HFA) 108 (90 Base) MCG/ACT inhaler 2 puffs 4 (four) times daily as needed. 05/27/09   [provider]  amphetamine-dextroamphetamine (ADDERALL) 10 MG tablet 1 tablet daily. 05/07/19   [provider]  azelastine (ASTELIN) 0.1 % nasal spray Place 1 spray into the nose daily.    [provider]  budesonide-formoterol (SYMBICORT) 160-4.5 MCG/ACT inhaler Inhale 2 puffs into the lungs 2 (two) times daily.    [provider]  citalopram (CELEXA) 20 MG tablet Take 20 mg by mouth daily.    [provider]  clonazePAM (KLONOPIN) 0.5 MG tablet Take 0.5 mg by mouth as  directed. Take 1 in the morning and 2 in the afternoon    [provider]  diclofenac sodium (VOLTAREN) 1 % GEL Apply topically as needed.    [provider]  dicyclomine (BENTYL) 10 MG capsule Take 10 mg by mouth 2 (two) times daily.    [provider]  fluconazole (DIFLUCAN) 100 MG tablet Take 1 tablet (100 mg total) by mouth daily. Take Diflucan 200 mg for first day only, than 100 mg once a day for 14 days. 08/25/19   Lajuan Pila, MD  fluticasone (FLONASE) 50 MCG/ACT nasal spray Place 2 sprays into both nostrils daily.    [provider]  loratadine (CLARITIN) 10 MG tablet Take 10 mg by mouth daily.    [provider]  Multiple Vitamin (MULTIVITAMIN) tablet Take 1 tablet by mouth daily.    [provider]  omeprazole (PRILOSEC) 20 MG capsule Take 20 mg by mouth 2 (two) times daily.    [provider]  phenylephrine (SUDAFED PE) 10 MG TABS tablet Take 10 mg by mouth every 4 (four) hours as needed.    [provider]  tamsulosin (FLOMAX) 0.4 MG CAPS capsule Take 0.4 mg by mouth daily.    [provider]  TESTOSTERONE CYPIONATE IJ Inject 1 mL as directed as directed. Every other week    [provider]      Allergies    Shellfish allergy,  Aspirin, and Penicillin g    Review of Systems   Review of Systems  Respiratory:  Negative for shortness of breath.   Psychiatric/Behavioral:  The patient is nervous/anxious.   All other systems reviewed and are negative.   Physical Exam Updated Vital Signs BP (!) 118/99 (BP Location: Right Arm)   Pulse 92   Temp 98 F (36.7 C) (Oral)   Resp (!) 30   Ht 5\' 9"  (1.753 m)   Wt 120.7 kg   SpO2 98%   BMI 39.30 kg/m  Physical Exam Vitals and nursing note reviewed.  Constitutional:      Appearance: He is well-developed.  HENT:     Head: Normocephalic.  Cardiovascular:     Rate and Rhythm: Normal rate.  Pulmonary:     Comments: Pt hyperventilating   Abdominal:     General: There is no distension.  Musculoskeletal:        General: Normal range of motion.  Skin:    General: Skin is warm.  Neurological:     General: No focal deficit present.     Mental Status: He is alert and oriented to person, place, and time.     ED Results / Procedures / Treatments   Labs (all labs ordered are listed, but only abnormal results are displayed) Labs Reviewed  I-STAT VENOUS BLOOD GAS, ED - Abnormal; Notable for the following components:      Result Value   pH, Ven 7.548 (*)    pCO2, Ven 23.9 (*)    pO2, Ven 27 (*)    Calcium, Ion 1.10 (*)    All other components within normal limits  CBC WITH DIFFERENTIAL/PLATELET  COMPREHENSIVE METABOLIC PANEL WITH GFR  PROTIME-INR  APTT  I-STAT VENOUS BLOOD GAS, ED    EKG None  Radiology No results found.  Procedures Procedures    Medications Ordered in ED Medications  LORazepam (ATIVAN) injection 1 mg (has no administration in time range)    ED Course/ Medical Decision Making/ A&P                                 Medical Decision Making Patient reports he was hospitalized at [facility] with pneumonia 1 month ago.  He does not recall anything about this hospitalization he states that he woke up at [facility] with a trach and a chest tube.  Patient had chest tube removed today and has developed a pneumothorax.  Patient sent here for evaluation by CVTS.  Amount and/or Complexity of Data Reviewed Independent Historian:     Details: History provided by [facility] who transported patient to this facility.  Physician at [facility] provided history Labs: ordered. Decision-making details documented in ED Course.    Details: Labs ordered reviewed and interpreted white blood cell count is 13.1. Radiology: ordered and independent interpretation performed. Decision-making details documented in ED Course.    Details: Chest x-ray shows left-sided pneumothorax ECG/medicine tests: ordered and independent  interpretation performed. Decision-making details documented in ED Course.    Details: EKG normal sinus  Discussion of management or test interpretation with external provider(s): I spoke to Nurse in Or with Dr. Sherene Dilling, she will relay message.  Dr. Sherene Dilling in OR.   Risk Prescription drug management.    I discussed patient with Dr. Melvinia Stager hospitalist who will see for admission.  She requested that I consult pulmonary critical care.  I spoke with Dr. Mason Sole who advised patient can be  admitted to hospitalist service.  He reports pulmonary will consult in the a.m.  He advised hospitalist can reach out to him if any concerns.  He advised to have Dr. Ames Bakes reach out to him if needed.  He advised obtaining a CT scan to see if patient has any loculations.       Final Clinical Impression(s) / ED Diagnoses Final diagnoses:  Pneumothorax, left    Rx / DC Orders ED Discharge Orders     None         Evelyn Hire 01/01/24 2108    Sandi Crosby, PA-C 01/01/24 2247    Merdis Stalling, MD 01/02/24 [phone]

## 2024-01-01 NOTE — ED Provider Notes (Incomplete)
 [place] EMERGENCY DEPARTMENT AT [facility] Provider Note   CSN: [id number] Arrival date & time: 01/01/24  1921     History {Add pertinent medical, surgical, social history, OB history to HPI:1} Chief Complaint  Patient presents with   Chest Injury    Cyan Yanik is a 53 y.o. male with PMH as listed below who presents BIBEMS as a transfer from [facility]. 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 Had chest tube removed this AM at [facility] and has recurrent PTX, so was sent to ED to be evaluated by cardiothoracic surgery. Provider at [facility] spoke to hospitalists/intensivists here before arrival.    Past Medical History:  Diagnosis Date   Anxiety    Arthritis    Asthma    Depression    Elevated cholesterol    Hypertension    Kidney stone    Obesity    Sleep apnea    does have a cpap machine       Home Medications Prior to Admission medications   Medication Sig Start Date End Date Taking? Authorizing Provider  albuterol (VENTOLIN HFA) 108 (90 Base) MCG/ACT inhaler 2 puffs 4 (four) times daily as needed. 05/27/09   [provider]  amphetamine-dextroamphetamine (ADDERALL) 10 MG tablet 1 tablet daily. 05/07/19   [provider]  azelastine (ASTELIN) 0.1 % nasal spray Place 1 spray into the nose daily.    [provider]  budesonide-formoterol (SYMBICORT) 160-4.5 MCG/ACT inhaler Inhale 2 puffs into the lungs 2 (two) times daily.    [provider]  citalopram (CELEXA) 20 MG tablet Take 20 mg by mouth daily.    [provider]  clonazePAM (KLONOPIN) 0.5 MG tablet Take 0.5 mg by mouth as directed. Take 1 in the morning and 2 in the afternoon    [provider]  diclofenac sodium (VOLTAREN) 1 % GEL Apply topically as needed.    [provider]  dicyclomine (BENTYL) 10 MG capsule Take 10 mg by mouth 2 (two) times daily.    [provider]  fluconazole  (DIFLUCAN) 100 MG tablet Take 1 tablet (100 mg total) by mouth daily. Take Diflucan 200 mg for first day only, than 100 mg once a day for 14 days. 08/25/19   Lajuan Pila, MD  fluticasone (FLONASE) 50 MCG/ACT nasal spray Place 2 sprays into both nostrils daily.    [provider]  loratadine (CLARITIN) 10 MG tablet Take 10 mg by mouth daily.    [provider]  Multiple Vitamin (MULTIVITAMIN) tablet Take 1 tablet by mouth daily.    [provider]  omeprazole (PRILOSEC) 20 MG capsule Take 20 mg by mouth 2 (two) times daily.    [provider]  phenylephrine (SUDAFED PE) 10 MG TABS tablet Take 10 mg by mouth every 4 (four) hours as needed.    [provider]  tamsulosin (FLOMAX) 0.4 MG CAPS capsule Take 0.4 mg by mouth daily.    [provider]  TESTOSTERONE CYPIONATE IJ Inject 1 mL as directed as directed. Every other week    [provider]      Allergies    Shellfish allergy, Aspirin, and Penicillin g    Review of Systems   Review of Systems A 10 point review of systems was performed and is negative unless otherwise reported in HPI.  Physical Exam Updated Vital Signs There were no vitals taken for this visit. Physical Exam General: Normal appearing {Desc; male/male:11659}, lying in  bed.  HEENT: PERRLA, Sclera anicteric, MMM, trachea midline.  Cardiology: RRR, no murmurs/rubs/gallops. BL radial and DP pulses equal bilaterally.  Resp: Normal respiratory rate and effort. CTAB, no wheezes, rhonchi, crackles.  Abd: Soft, non-tender, non-distended. No rebound tenderness or guarding.  GU: Deferred. MSK: No peripheral edema or signs of trauma. Extremities without deformity or TTP. No cyanosis or clubbing. Skin: warm, dry. No rashes or lesions. Back: No CVA tenderness Neuro: A&Ox4, CNs II-XII grossly intact. MAEs. Sensation grossly intact.  Psych: Normal mood and affect.   ED Results / Procedures / Treatments   Labs (all  labs ordered are listed, but only abnormal results are displayed) Labs Reviewed - No data to display  EKG None  Radiology No results found.  Procedures Procedures  {Document cardiac monitor, telemetry assessment procedure when appropriate:1}  Medications Ordered in ED Medications - No data to display  ED Course/ Medical Decision Making/ A&P                          Medical Decision Making   This patient presents to the ED for concern of ***, this involves an extensive number of treatment options, and is a complaint that carries with it a high risk of complications and morbidity.  I considered the following differential and admission for this acute, potentially life threatening condition.   MDM:    ***     Labs: I Ordered, and personally interpreted labs.  The pertinent results include:  ***  Imaging Studies ordered: I ordered imaging studies including *** I independently visualized and interpreted imaging. I agree with the radiologist interpretation  Additional history obtained from ***.  External records from outside source obtained and reviewed including ***  Cardiac Monitoring: The patient was maintained on a cardiac monitor.  I personally viewed and interpreted the cardiac monitored which showed an underlying rhythm of: ***  Reevaluation: After the interventions noted above, I reevaluated the patient and found that they have :{resolved/improved/worsened:23923::"improved"}  Social Determinants of Health: ***  Disposition:  ***  Co morbidities that complicate the patient evaluation  Past Medical History:  Diagnosis Date   Anxiety    Arthritis    Asthma    Depression    Elevated cholesterol    Hypertension    Kidney stone    Obesity    Sleep apnea    does have a cpap machine     Medicines No orders of the defined types were placed in this encounter.   I have reviewed the patients home medicines and have made adjustments as needed  Problem List  / ED Course: Problem List Items Addressed This Visit   None        {Document critical care time when appropriate:1} {Document review of labs and clinical decision tools ie heart score, Chads2Vasc2 etc:1}  {Document your independent review of radiology images, and any outside records:1} {Document your discussion with family members, caretakers, and with consultants:1} {Document social determinants of health affecting pt's care:1} {Document your decision making why or why not admission, treatments were needed:1}  This note was created using dictation software, which may contain spelling or grammatical errors.

## 2024-01-01 NOTE — Plan of Care (Addendum)
 Robert Erickson is a 53 y.o. 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  Per prior hospitalist email communication the patient has has recurrent pleural effusion and pneumothorax.  This case was discussed with [facility] hospitalist Dr. Amador Bad in the daytime and has been declined admission under hospitalist service given this case needs to be involved with cardiothoracic/pulmonary service.  Patient reported that he developed pneumonia 1 month ago. Patient states he was seen at [facility]. Patient has no recall of his hospitalization there. Patient states the first thing he remembers is waking up at [facility] with a trach and a chest tube. Patient reports he has been doing well at [facility]. He reports he has been able to walk 1000 steps. Patient states he is scheduled to go home tomorrow. Patient reports he tolerated having trach removed and is breathing well on his own. He states his trach site has almost healed. Patient reports that they removed the chest tube. He states he feels like he is breathing okay currently. Patient complains of severe anxiety. Patient is very upset of being at the hospital. He is very scared of what is going to happen to him since he will be discharged to home.   ED Course:  Presentation to ED patient found borderline hypotensive blood pressure 118/99, tachypneic 30 which has been dropped to 14, O2 sat 98 to 93% on room air. VBG showing pH 7.5, pCO2 23, PO227 bicarb 20. CMP unremarkable. CBC showing leukocytosis 13.1. Normal pro time INR. EKG showing sinus tachycardia heart rate 92.  Chest x-ray showing recurrent and increased left-sided pneumothorax as compared to prior  exam.  Hospitalist has been consulted for the management of pneumothorax.  ED provider has been consulted thoracic surgeon Dr.Bartle who is busy with another patient in the OR and will call back soon.  Requested ED provider to consult PCCM/pulmonary Dr. Mason Sole for further recommendation.  Deferring admission at this time.

## 2024-01-02 ENCOUNTER — Inpatient Hospital Stay (HOSPITAL_COMMUNITY)

## 2024-01-02 ENCOUNTER — Encounter (HOSPITAL_COMMUNITY): Payer: Self-pay | Admitting: Internal Medicine

## 2024-01-02 DIAGNOSIS — J939 Pneumothorax, unspecified: Secondary | ICD-10-CM | POA: Diagnosis not present

## 2024-01-02 LAB — CBC
HCT: 41.8 % (ref 39.0–52.0)
Hemoglobin: 13.4 g/dL (ref 13.0–17.0)
MCH: 30.1 pg (ref 26.0–34.0)
MCHC: 32.1 g/dL (ref 30.0–36.0)
MCV: 93.9 fL (ref 80.0–100.0)
Platelets: 278 10*3/uL (ref 150–400)
RBC: 4.45 MIL/uL (ref 4.22–5.81)
RDW: 12.8 % (ref 11.5–15.5)
WBC: 8.7 10*3/uL (ref 4.0–10.5)
nRBC: 0 % (ref 0.0–0.2)

## 2024-01-02 LAB — COMPREHENSIVE METABOLIC PANEL WITH GFR
ALT: 29 U/L (ref 0–44)
AST: 16 U/L (ref 15–41)
Albumin: 2.8 g/dL — ABNORMAL LOW (ref 3.5–5.0)
Alkaline Phosphatase: 86 U/L (ref 38–126)
Anion gap: 6 (ref 5–15)
BUN: 11 mg/dL (ref 6–20)
CO2: 24 mmol/L (ref 22–32)
Calcium: 8.9 mg/dL (ref 8.9–10.3)
Chloride: 107 mmol/L (ref 98–111)
Creatinine, Ser: 0.85 mg/dL (ref 0.61–1.24)
GFR, Estimated: >60 mL/min (ref >60)
Glucose, Bld: 89 mg/dL (ref 70–99)
Potassium: 3.2 mmol/L — ABNORMAL LOW (ref 3.5–5.1)
Sodium: 137 mmol/L (ref 135–145)
Total Bilirubin: 0.7 mg/dL (ref 0.0–1.2)
Total Protein: 6.3 g/dL — ABNORMAL LOW (ref 6.5–8.1)

## 2024-01-02 MED ORDER — LIDOCAINE HCL 1 % IJ SOLN
INTRAMUSCULAR | Status: AC
  Filled 2024-01-02: qty 20

## 2024-01-02 MED ORDER — FENTANYL CITRATE (PF) 100 MCG/2ML IJ SOLN
INTRAMUSCULAR | Status: AC | PRN
  Administered 2024-01-02 (×2): 25 ug via INTRAVENOUS

## 2024-01-02 MED ORDER — MORPHINE SULFATE (PF) 2 MG/ML IV SOLN
1.0000 mg | INTRAVENOUS | Status: DC | PRN
  Administered 2024-01-02 – 2024-01-04 (×16): 2 mg via INTRAVENOUS
  Filled 2024-01-02 (×17): qty 1

## 2024-01-02 MED ORDER — FENTANYL CITRATE (PF) 100 MCG/2ML IJ SOLN
INTRAMUSCULAR | Status: AC
Start: 2024-01-02 — End: ?
  Filled 2024-01-02: qty 2

## 2024-01-02 MED ORDER — MORPHINE SULFATE (PF) 2 MG/ML IV SOLN: 1.0000 mg | INTRAVENOUS | Status: DC | PRN

## 2024-01-02 MED ORDER — POTASSIUM CHLORIDE CRYS ER 20 MEQ PO TBCR
40.0000 meq | EXTENDED_RELEASE_TABLET | Freq: Once | ORAL | Status: AC
  Administered 2024-01-02: 40 meq via ORAL
  Filled 2024-01-02: qty 2

## 2024-01-02 MED ORDER — LIDOCAINE-EPINEPHRINE 1 %-1:100000 IJ SOLN
INTRAMUSCULAR | Status: AC
  Filled 2024-01-02: qty 1

## 2024-01-02 MED ORDER — ENSURE ENLIVE PO LIQD
237.0000 mL | Freq: Two times a day (BID) | ORAL | Status: DC
Start: 2024-01-03 — End: 2024-01-06
  Administered 2024-01-03 – 2024-01-05 (×4): 237 mL via ORAL

## 2024-01-02 MED ORDER — MIDAZOLAM HCL 2 MG/2ML IJ SOLN
INTRAMUSCULAR | Status: AC
  Filled 2024-01-02: qty 2

## 2024-01-02 MED ORDER — ESCITALOPRAM OXALATE 10 MG PO TABS
20.0000 mg | ORAL_TABLET | Freq: Every day | ORAL | Status: DC
Start: 2024-01-02 — End: 2024-01-06
  Administered 2024-01-02 – 2024-01-04 (×3): 20 mg via ORAL
  Filled 2024-01-02 (×3): qty 2

## 2024-01-02 MED ORDER — MIDAZOLAM HCL 2 MG/2ML IJ SOLN
INTRAMUSCULAR | Status: AC | PRN
  Administered 2024-01-02 (×3): 1 mg via INTRAVENOUS

## 2024-01-02 NOTE — ED Notes (Signed)
 Patient transported to IR

## 2024-01-02 NOTE — H&P (Incomplete)
 History and Physical    Robert Erickson ZOX:[id number] DOB: February 18, 1971 DOA: 01/01/2024  PCP: Patient, No Pcp Per   Patient coming from: Home   Chief Complaint:  Chief Complaint  Patient presents with  . Chest Injury   ED TRIAGE note:PT with a extensive respiratory HX BIB [facility] from [facility]. PT has a pneumothorax. A=OX 4, O2 stats are  93-96% RA. PT dose have high anxiety which is contributing to him running high sinus-ST.PT is also c/o pain from bedsores from the facility he came from   HPI:  Robert Erickson is a 53 y.o. 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  Patient has been transferred from [facility].  The chest tube has been removed today morning at the [facility] and patient found to have recurrent pneumothorax so he has been sent to the ED for cardiothoracic/pulmonary evaluation.  Patient reported that he developed pneumonia 1 month ago. Patient states he was seen at [facility]. Patient has no recall of his hospitalization there. Patient states the first thing he remembers is waking up at [facility] with a trach and a chest tube. Patient reports he has been doing well at [facility]. He reports he has been able to walk 1000 steps. Patient states he is scheduled to go home tomorrow. Patient reports he tolerated having trach removed and is breathing well on his own. He states his trach site has almost healed. Patient reports that they removed the chest tube. He states he feels like he is breathing okay currently. Patient complains of severe anxiety. Patient is very upset of being at the hospital. He is very scared of what is going to happen to him since he will be  discharged to home.   ED Course:  Presentation to ED patient found borderline hypotensive blood pressure 118/99, tachypneic 30 which has been dropped to 14, O2 sat 98 to 93% on room air. VBG showing pH 7.5, pCO2 23, PO227 bicarb 20. CMP unremarkable. CBC showing leukocytosis 13.1. Normal pro time INR. EKG showing sinus tachycardia heart rate 92.  Chest x-ray showing recurrent and increased left-sided pneumothorax as compared to prior exam.  Hospitalist has been consulted for the management of pneumothorax.   ED provider has been consulted thoracic surgeon Dr.Bartle who is busy with another patient in the OR and will call back soon.   Update, EDP discussed case with PCCM Dr. Sherryll Burger recommended admit patient under hospitalist service.  Given patient is hemodynamically stable and O2 sat 93% so at this point there is no need for ICU level of care however ICU will be available for further assistance if complication arises..  Recommended to follow-up with cardiothoracic surgery and obtain CT chest for the loculation.    Review of Systems:  Review of Systems  Constitutional:  Negative for chills, fever, malaise/fatigue and weight loss.  Respiratory:  Negative for cough, sputum production, shortness of breath and wheezing.   Cardiovascular:  Positive for chest pain and palpitations. Negative for orthopnea and leg swelling.  Gastrointestinal:  Negative for heartburn, nausea and vomiting.  Musculoskeletal:  Negative for back pain, falls, joint pain, myalgias and neck pain.  Neurological:  Positive for tremors. Negative for dizziness, focal weakness, seizures, loss of consciousness, weakness and headaches.  Psychiatric/Behavioral:  Positive for depression. Negative for hallucinations.  The patient is nervous/anxious.     Past Medical History:  Diagnosis Date  . Anxiety   . Arthritis   . Asthma   . Depression   . Elevated cholesterol   . Hypertension   . Kidney stone   . Obesity   . Sleep  apnea    does have a cpap machine    Past Surgical History:  Procedure Laterality Date  . CHOLECYSTECTOMY    . COLONOSCOPY  01/17/2007   Diminuitive colonic polyp status post polypectomy. Minimal sigmoid diverticulosis. Incidental note of small diverticulum in the TI.  . TONSILLECTOMY       reports that he has been smoking cigarettes. He has never used smokeless tobacco. He reports current alcohol use. He reports that he does not currently use drugs after having used the following drugs: Marijuana.  Allergies  Allergen Reactions  . Shellfish Allergy Anaphylaxis  . Aspirin Rash  . Penicillin G Rash    Family History  Problem Relation Age of Onset  . Pancreatic cancer Mother   . Lung cancer Maternal Grandfather   . Colon cancer Neg Hx   . Esophageal cancer Neg Hx   . Stomach cancer Neg Hx   . Rectal cancer Neg Hx     Prior to Admission medications   Medication Sig Start Date End Date Taking? Authorizing Provider  albuterol (VENTOLIN HFA) 108 (90 Base) MCG/ACT inhaler 2 puffs 4 (four) times daily as needed. 05/27/09   [provider]  amphetamine-dextroamphetamine (ADDERALL) 10 MG tablet 1 tablet daily. 05/07/19   [provider]  azelastine (ASTELIN) 0.1 % nasal spray Place 1 spray into the nose daily.    [provider]  budesonide-formoterol (SYMBICORT) 160-4.5 MCG/ACT inhaler Inhale 2 puffs into the lungs 2 (two) times daily.    [provider]  citalopram (CELEXA) 20 MG tablet Take 20 mg by mouth daily.    [provider]  clonazePAM (KLONOPIN) 0.5 MG tablet Take 0.5 mg by mouth as directed. Take 1 in the morning and 2 in the afternoon    [provider]  diclofenac sodium (VOLTAREN) 1 % GEL Apply topically as needed.    [provider]  dicyclomine (BENTYL) 10 MG capsule Take 10 mg by mouth 2 (two) times daily.    [provider]  fluconazole (DIFLUCAN) 100 MG tablet Take 1 tablet (100 mg total) by mouth  daily. Take Diflucan 200 mg for first day only, than 100 mg once a day for 14 days. 08/25/19   Lynann Bologna, MD  fluticasone (FLONASE) 50 MCG/ACT nasal spray Place 2 sprays into both nostrils daily.    [provider]  loratadine (CLARITIN) 10 MG tablet Take 10 mg by mouth daily.    [provider]  Multiple Vitamin (MULTIVITAMIN) tablet Take 1 tablet by mouth daily.    [provider]  omeprazole (PRILOSEC) 20 MG capsule Take 20 mg by mouth 2 (two) times daily.    [provider]  phenylephrine (SUDAFED PE) 10 MG TABS tablet Take 10 mg by mouth every 4 (four) hours as needed.    [provider]  tamsulosin (FLOMAX) 0.4 MG CAPS capsule Take 0.4 mg by mouth daily.    [provider]  TESTOSTERONE CYPIONATE IJ Inject 1 mL as directed as directed. Every other week    [provider]     Physical Exam: Vitals:   01/01/24 2000 01/01/24 2030 01/01/24 2045 01/01/24 2100  BP: (!) 138/92 118/85 (!) 118/90 Marland Kitchen)  117/92  Pulse: 98 91 95 86  Resp: 15 16 15 14   Temp:      TempSrc:      SpO2: 98% 95% 93% 93%  Weight:      Height:        Physical Exam   Labs on Admission: I have personally reviewed following labs and imaging studies  CBC: Recent Labs  Lab 01/01/24 1940 01/01/24 1942  WBC 13.1*  --   NEUTROABS 9.0*  --   HGB 14.6 15.0  HCT 45.2 44.0  MCV 92.1  --   PLT 351  --    Basic Metabolic Panel: Recent Labs  Lab 01/01/24 1940 01/01/24 1942  NA 138 138  K 3.9 3.9  CL 106  --   CO2 21*  --   GLUCOSE 95  --   BUN 13  --   CREATININE 0.89  --   CALCIUM 9.7  --    GFR: Estimated Creatinine Clearance: 124.6 mL/min (by C-G formula based on SCr of 0.89 mg/dL). Liver Function Tests: Recent Labs  Lab 01/01/24 1940  AST 19  ALT 38  ALKPHOS 113  BILITOT 0.7  PROT 7.6  ALBUMIN 3.3*   No results for input(s): "LIPASE", "AMYLASE" in the last 168 hours. No results for input(s): "AMMONIA" in the last 168  hours. Coagulation Profile: Recent Labs  Lab 01/01/24 1940  INR 1.0   Cardiac Enzymes: No results for input(s): "CKTOTAL", "CKMB", "CKMBINDEX", "TROPONINI", "TROPONINIHS" in the last 168 hours. BNP (last 3 results) No results for input(s): "BNP" in the last 8760 hours. HbA1C: No results for input(s): "HGBA1C" in the last 72 hours. CBG: No results for input(s): "GLUCAP" in the last 168 hours. Lipid Profile: No results for input(s): "CHOL", "HDL", "LDLCALC", "TRIG", "CHOLHDL", "LDLDIRECT" in the last 72 hours. Thyroid Function Tests: No results for input(s): "TSH", "T4TOTAL", "FREET4", "T3FREE", "THYROIDAB" in the last 72 hours. Anemia Panel: No results for input(s): "VITAMINB12", "FOLATE", "FERRITIN", "TIBC", "IRON", "RETICCTPCT" in the last 72 hours. Urine analysis: No results found for: "COLORURINE", "APPEARANCEUR", "LABSPEC", "PHURINE", "GLUCOSEU", "HGBUR", "BILIRUBINUR", "KETONESUR", "PROTEINUR", "UROBILINOGEN", "NITRITE", "LEUKOCYTESUR"  Radiological Exams on Admission: I have personally reviewed images CT CHEST WO CONTRAST Result Date: 01/01/2024 CLINICAL DATA:  Chest injury, pneumothorax EXAM: CT CHEST WITHOUT CONTRAST TECHNIQUE: Multidetector CT imaging of the chest was performed following the standard protocol without IV contrast. RADIATION DOSE REDUCTION: This exam was performed according to the departmental dose-optimization program which includes automated exposure control, adjustment of the mA and/or kV according to patient size and/or use of iterative reconstruction technique. COMPARISON:  CT 11/30/2023.  Chest x-ray today. FINDINGS: Cardiovascular: Heart is normal size. Aorta is normal caliber. Mediastinum/Nodes: No mediastinal, hilar, or axillary adenopathy. Small amount of pneumomediastinum noted in the anterior superior mediastinum. Trachea and esophagus are unremarkable. Thyroid unremarkable. Stoma noted in the lower neck. Lungs/Pleura: Moderate to severe centrilobular and  paraseptal emphysema. Bulla in the apices bilaterally. Subsegmental atelectasis in the lower lobes bilaterally. Small to moderate left pneumothorax noted, likely approximately 15-20%. This is slightly smaller than the pneumothorax seen on CT from 11/30/2023. No effusions. Upper Abdomen: No acute findings Musculoskeletal: Left subcutaneous emphysema. No acute bony abnormality. IMPRESSION: Recurrent 15-20% left pneumothorax. This is slightly smaller than the pneumothorax seen on prior CT from 11/30/2023. Small amount of pneumomediastinum noted. Moderate to advanced centrilobular and paraseptal emphysema with upper lobe bulla bilaterally. Electronically Signed   By: Charlett Nose M.D.   On: 01/01/2024 23:28   DG Chest Portable  1 View Result Date: 01/01/2024 CLINICAL DATA:  Follow-up left pneumothorax EXAM: PORTABLE CHEST 1 VIEW COMPARISON:  12/01/2023 FINDINGS: Pigtail catheter has been removed on the left. There is a recurrent pneumothorax identified along the apex and laterally on the left. Right lung remains clear. Cardiac shadow is within normal limits. No bony abnormality is seen. IMPRESSION: Recurrent and increased left-sided pneumothorax when compared with the prior exam. Critical Value/emergent results were called by telephone at the time of interpretation on 01/01/2024 at 8:50 pm to Dr. Vivi Barrack , who verbally acknowledged these results. Electronically Signed   By: Alcide Clever M.D.   On: 01/01/2024 20:52     EKG: My personal interpretation of EKG shows: Sinus tachycardia heart rate 90.    Assessment/Plan: Principal Problem:   Pneumothorax, left Active Problems:   Bronchopleural fistula (HCC)   Essential hypertension   ADHD   Generalized anxiety disorder   IBS (irritable bowel syndrome)   Depression   GERD (gastroesophageal reflux disease)   Asthma, chronic   Obstructive sleep apnea   Leukocytosis   Tension pneumothorax    Assessment and Plan: Tension pneumothorax History of  recurrent pneumothorax History of tracheostomy s/p decannulation -Patient has been transferred from [facility] to [facility], ED for evaluation for development of recurrent pneumothorax.  Patient was being admitted on [facility] secondary to tension pneumothorax was on ventilator afterward had tracheostomy followed by decannulation.  During that time when the chest tube has been removed patient redeveloped pneumothorax likely secondary from bronchopleural fistula.  He was discharged to [facility] and the chest tube has been removed today 4/15.  Again patient has been developed tension pneumothorax. - At presentation to ED hemodynamically stable.  However patient is very anxious and hyperactive.  O2 sat 93 to 98% on room air. - Afebrile.  CBC showing mild leukocytosis. - Chest x-ray no evidence of pneumonia however it is showing Recurrent and increased left-sided pneumothorax when compared with the prior exam. -Cardiothoracic surgeon Dr. Charm Barges has been consulted pending recommendation. -PCCM Dr. Sherryll Burger has been consulted given patient is hemodynamically stable patient can be managed at progressive unit.  And ICU will be available for any assistance in case of complication arises.  Recommended to obtain CT chest to loculation of the pneumothorax. -In the setting of recurrent pneumothorax and development of bronchopleural fistula patient probably need a stent placement versus chest tube placement. -CT chest showing recurrent 15 to 20% left pneumothorax which is slightly smaller than previous pneumothorax from prior CT 11/30/2023. -Currently patient is hemodynamically stable.  Admitting to progressive unit. - Avoid any oxygen therapy in the setting of pneumothorax unless patient develops hypoxia O2 sat drop below 88%. -Continue pain control, Dulera and DuoNeb as needed. - Monitor vitals, check telemetry. -Keeping patient n.p.o. starting maintenance fluid. -Deferring any pharmacological DVT prophylaxis in  the setting of tension pneumothorax-high risk of bleeding.   Reactive leukocytosis -WBC count 13.  Reactive leukocytosis in the setting of tension pneumothorax.  No evidence of pneumonia at this time.  Continue to monitor clinically  Essential hypertension -Currently not any blood pressure regimen.  Generalized anxiety disorder Depression Insomnia -Patient is very anxious and hyperactive.  Per chart review patient has history of ADHD, generalized anxiety disorder depression. - Starting Wellbutrin 150 mg daily, continue Klonopin 0.5 mg twice daily. -Continue trazodone at bedtime as needed   ADHD - Continue Adderall 10 mg twice daily.   DVT prophylaxis:  SCDs Code Status:  Full Code Diet: Heart healthy diet  Family Communication: Currently no family member at bedside Disposition Plan: Waiting for formal CT surgery recommendation. Consults: Cardiothoracic surgery and PCCM. Admission status:   Inpatient, Step Down Unit  Severity of Illness: The appropriate patient status for this patient is INPATIENT. Inpatient status is judged to be reasonable and necessary in order to provide the required intensity of service to ensure the patient's safety. The patient's presenting symptoms, physical exam findings, and initial radiographic and laboratory data in the context of their chronic comorbidities is felt to place them at high risk for further clinical deterioration. Furthermore, it is not anticipated that the patient will be medically stable for discharge from the hospital within 2 midnights of admission.   * I certify that at the point of admission it is my clinical judgment that the patient will require inpatient hospital care spanning beyond 2 midnights from the point of admission due to high intensity of service, high risk for further deterioration and high frequency of surveillance required.Aaron Aas    Chasitie Passey, MD [facility]  How to contact the [facility] Attending or Consulting  provider 7A - 7P or covering provider during after hours 7P -7A, for this patient.  Check the care team in [facility] and look for a) attending/consulting [facility] provider listed and b) the [facility] team listed Log into [email or web address] and use [place]'s universal password to access. If you do not have the password, please contact the hospital operator. Locate the [facility] provider you are looking for under [facility] and page to a number that you can be directly reached. If you still have difficulty reaching the provider, please page the [facility] (Director on Call) for the Hospitalists listed on amion for assistance.  01/02/2024, 12:04 AM

## 2024-01-02 NOTE — Consult Note (Signed)
 Chief Complaint: Patient was seen in consultation today for recurrent left PTX Chief Complaint  Patient presents with   Chest Injury   at the request of Thedora Hinders [facility]) / Rozann Lesches (PCCM)  Referring Physician(s): Thedora Hinders, [facility]) / Rozann Lesches (PCCM)  Supervising Physician: Oley Balm  Patient Status: [facility] - In-pt  History of Present Illness: Robert Erickson is a 53 y.o. male with PMHs of HTN, kidney stone, anxiety, depression and recurrent left PTX who presents for chest tube placement.   Patient had PNA and was admitted at [facility] then he was discharged to [facility] where he required tracheostomy and left chest tube placement. The left chest tube was removed on 4/15 in preparation of discharge, however the patient developed recurrent left PTX and he was sent to [facility] ED for further eval and management. In ED he was hemodynamically stable, CT chest showed Recurrent 15-20% left pneumothorax. PCCM was consulted who recommended cardiothoracic sx consult as well as IR consult for left chest tube placement. Case reviewed and approved by Dr. Deanne Coffer, patient seen in [facility] ED.   Patient sitting in bed, not in acute distress but appears very anxious.  Reports SOB and left chest pain with inspiration.  Denise headache, fever, chills, abdominal pain, nausea ,vomiting, and bleeding. States that his trach was removed and the wound is almost healed.  He states that he was told that can go home with the chest tube in place, he was informed that that is not what IR typically recommends, typically patient gets admitted for at least day or two for follow up CXR.    Past Medical History:  Diagnosis Date   Anxiety    Arthritis    Asthma    Depression    Elevated cholesterol    Hypertension    Kidney stone    Obesity    Sleep apnea    does have a cpap machine    Past Surgical History:  Procedure Laterality Date   CHOLECYSTECTOMY     COLONOSCOPY  01/17/2007   [facility]uitive  colonic polyp status post polypectomy. Minimal sigmoid diverticulosis. Incidental note of small diverticulum in the TI.   TONSILLECTOMY      Allergies: Shellfish allergy, Shellfish-derived products, Aspirin, and Penicillin g  Medications: Prior to Admission medications   Medication Sig Start Date End Date Taking? Authorizing Provider  acetaminophen (TYLENOL) 650 MG CR tablet Take 650 mg by mouth every 6 (six) hours as needed for pain or fever.   Yes [provider]  albuterol (PROVENTIL) (2.5 MG/3ML) 0.083% nebulizer solution Take 2.5 mg by nebulization every 6 (six) hours as needed for wheezing or shortness of breath.   Yes [provider]  atorvastatin (LIPITOR) 10 MG tablet Take 10 mg by mouth at bedtime.   Yes [provider]  buPROPion (WELLBUTRIN SR) 200 MG 12 hr tablet Take 200 mg by mouth daily.   Yes [provider]  docusate sodium (COLACE) 100 MG capsule Take 100 mg by mouth every 12 (twelve) hours.   Yes [provider]  doxazosin (CARDURA) 2 MG tablet Take 2 mg by mouth at bedtime.   Yes [provider]  enoxaparin (LOVENOX) 40 MG/0.4ML injection Inject 40 mg into the skin daily.   Yes [provider]  escitalopram (LEXAPRO) 20 MG tablet Take 20 mg by mouth at bedtime.   Yes [provider]  HYDROcodone-acetaminophen (NORCO/VICODIN) 5-325 MG tablet Take 1 tablet by mouth every 6 (six) hours as needed for severe  pain (pain score 7-10).   Yes [provider]  LORazepam (ATIVAN) 0.5 MG tablet Take 0.5 mg by mouth every 8 (eight) hours.   Yes [provider]  mometasone-formoterol (DULERA) 100-5 MCG/ACT AERO Inhale 2 puffs into the lungs every 12 (twelve) hours.   Yes [provider]  ondansetron (ZOFRAN) 4 MG/2ML SOLN injection Inject 4 mg into the vein every 6 (six) hours as needed for nausea or vomiting.   Yes [provider]  pantoprazole (PROTONIX) 40 MG tablet Take 40 mg by  mouth daily.   Yes [provider]  polyethylene glycol (MIRALAX / GLYCOLAX) 17 g packet Take 17 g by mouth every 12 (twelve) hours.   Yes [provider]  predniSONE (DELTASONE) 20 MG tablet Take 20 mg by mouth daily.   Yes [provider]  Sodium Phosphates (PHOSPHATE ENEMA RE) Place 133 mLs rectally every 12 (twelve) hours as needed (constipation).   Yes [provider]  traZODone (DESYREL) 100 MG tablet Take 100 mg by mouth at bedtime.   Yes [provider]  zinc oxide 20 % ointment Apply 1 Application topically daily. To buttocks.   Yes [provider]  amphetamine-dextroamphetamine (ADDERALL) 10 MG tablet 1 tablet daily. Patient not taking: Reported on 01/02/2024 05/07/19   [provider]  clonazePAM (KLONOPIN) 0.5 MG tablet Take 0.5 mg by mouth as directed. Take 1 in the morning and 2 in the afternoon Patient not taking: Reported on 01/02/2024    [provider]  tamsulosin (FLOMAX) 0.4 MG CAPS capsule Take 0.4 mg by mouth daily. Patient not taking: Reported on 01/02/2024    [provider]     Family History  Problem Relation Age of Onset   Pancreatic cancer Mother    Lung cancer Maternal Grandfather    Colon cancer Neg Hx    Esophageal cancer Neg Hx    Stomach cancer Neg Hx    Rectal cancer Neg Hx     Social History   Socioeconomic History   Marital status: Married    Spouse name: Not on file   Number of children: Not on file   Years of education: Not on file   Highest education level: Not on file  Occupational History   Not on file  Tobacco Use   Smoking status: Every Day    Types: Cigarettes   Smokeless tobacco: Never  Vaping Use   Vaping status: Never Used  Substance and Sexual Activity   Alcohol use: Yes    Comment: occassionally    Drug use: Not Currently    Types: Marijuana   Sexual activity: Not on file  Other Topics Concern   Not on file  Social History Narrative   Not on  file   Social Drivers of Health   Financial Resource Strain: Not on file  Food Insecurity: Not on file  Transportation Needs: Not on file  Physical Activity: Not on file  Stress: Not on file  Social Connections: Not on file     Review of Systems: A 12 point ROS discussed and pertinent positives are indicated in the HPI above.  All other systems are negative.  Vital Signs: BP 115/82   Pulse 81   Temp 98.2 F (36.8 C) (Oral)   Resp 17   Ht 5\' 9"  (1.753 m)   Wt 266 lb 1.5 oz (120.7 kg)   SpO2 93%   BMI 39.30 kg/m    Physical Exam Vitals and nursing note reviewed.  Constitutional:      General: Patient is not in acute distress.    Appearance: Normal appearance. Patient is not ill-appearing.  HENT:     Head: Normocephalic and atraumatic.     Mouth/Throat:     Mouth: Mucous membranes are moist.     Pharynx: Oropharynx is clear.  Cardiovascular:     Rate and Rhythm: Normal rate and regular rhythm.     Pulses: Normal pulses.     Heart sounds: Normal heart sounds.  Pulmonary:     Effort: Pulmonary effort is normal.     Breath sounds: Normal breath sounds.  Abdominal:     General: Abdomen is flat. Bowel sounds are normal.     Palpations: Abdomen is soft.  Musculoskeletal:     Cervical back: Neck supple.  Skin:    General: Skin is warm and dry.     Coloration: Skin is not jaundiced or pale.  Neurological:     Mental Status: Patient is alert and oriented to person, place, and time.  Psychiatric:        Mood and Affect: Mood normal.        Behavior: Behavior normal.        Judgment: Judgment normal.    MD Evaluation Airway: WNL Heart: WNL Abdomen: WNL Chest/ Lungs: WNL ASA  Classification: 3 Mallampati/Airway Score: Two  Imaging: DG CHEST PORT 1 VIEW Result Date: 01/02/2024 CLINICAL DATA:  Pneumothorax EXAM: PORTABLE CHEST 1 VIEW COMPARISON:  Yesterday FINDINGS: Left pneumothorax is unchanged, seen from apex to base and measuring up to nearly 3 cm in  thickness laterally at the left base. Mild underlying pulmonary opacity from atelectasis by prior CT. Chest wall emphysema on the left. Clear right lung. Normal heart size and mediastinal contours. IMPRESSION: Unchanged moderate left pneumothorax when compared to yesterday. Electronically Signed   By: Tiburcio Pea M.D.   On: 01/02/2024 08:19   CT CHEST WO CONTRAST Result Date: 01/01/2024 CLINICAL DATA:  Chest injury, pneumothorax EXAM: CT CHEST WITHOUT CONTRAST TECHNIQUE: Multidetector CT imaging of the chest was performed following the standard protocol without IV contrast. RADIATION DOSE REDUCTION: This exam was performed according to the departmental dose-optimization program which includes automated exposure control, adjustment of the mA and/or kV according to patient size and/or use of iterative reconstruction technique. COMPARISON:  CT 11/30/2023.  Chest x-ray today. FINDINGS: Cardiovascular: Heart is normal size. Aorta is normal caliber. Mediastinum/Nodes: No mediastinal, hilar, or axillary adenopathy. Small amount of pneumomediastinum noted in the anterior superior mediastinum. Trachea and esophagus are unremarkable. Thyroid unremarkable. Stoma noted in the lower neck. Lungs/Pleura: Moderate to severe centrilobular and paraseptal emphysema. Bulla in the apices bilaterally. Subsegmental atelectasis in the lower lobes bilaterally. Small to moderate left pneumothorax noted, likely approximately 15-20%. This is slightly smaller than the pneumothorax seen on CT from 11/30/2023. No effusions. Upper Abdomen: No acute findings Musculoskeletal: Left subcutaneous emphysema. No acute bony abnormality. IMPRESSION: Recurrent 15-20% left pneumothorax. This is slightly smaller than the pneumothorax seen on prior CT from 11/30/2023. Small amount of pneumomediastinum noted. Moderate to advanced centrilobular and paraseptal emphysema with upper lobe bulla bilaterally. Electronically Signed   By: Charlett Nose M.D.   On:  01/01/2024 23:28   DG Chest Portable 1 View Result Date: 01/01/2024 CLINICAL DATA:  Follow-up left pneumothorax EXAM: PORTABLE CHEST 1 VIEW COMPARISON:  12/01/2023 FINDINGS: Pigtail catheter has been removed on the left. There is a recurrent pneumothorax identified along the apex and laterally on the left. Right lung remains  clear. Cardiac shadow is within normal limits. No bony abnormality is seen. IMPRESSION: Recurrent and increased left-sided pneumothorax when compared with the prior exam. Critical Value/emergent results were called by telephone at the time of interpretation on 01/01/2024 at 8:50 pm to Dr. Annita Kindle , who verbally acknowledged these results. Electronically Signed   By: Violeta Grey M.D.   On: 01/01/2024 20:52    Labs:  CBC: Recent Labs    01/01/24 1940 01/01/24 1942 01/02/24 0555  WBC 13.1*  --  8.7  HGB 14.6 15.0 13.4  HCT 45.2 44.0 41.8  PLT 351  --  278    COAGS: Recent Labs    01/01/24 1940  INR 1.0  APTT 30    BMP: Recent Labs    01/01/24 1940 01/01/24 1942 01/02/24 0555  NA 138 138 137  K 3.9 3.9 3.2*  CL 106  --  107  CO2 21*  --  24  GLUCOSE 95  --  89  BUN 13  --  11  CALCIUM 9.7  --  8.9  CREATININE 0.89  --  0.85  GFRNONAA >60  --  >60    LIVER FUNCTION TESTS: Recent Labs    01/01/24 1940 01/02/24 0555  BILITOT 0.7 0.7  AST 19 16  ALT 38 29  ALKPHOS 113 86  PROT 7.6 6.3*  ALBUMIN 3.3* 2.8*    TUMOR MARKERS: No results for input(s): "AFPTM", "CEA", "CA199", "CHROMGRNA" in the last 8760 hours.  Assessment and Plan: 53 y.o. male with recurrent left PTX who presents for left chest tube placement.   VSS CBC wnl CXR today showed stable left PTX Not on AC/AP  Risks and benefits of chest tube placement were discussed with the patient including bleeding, infection, damage to adjacent structures, malfunction of the tube requiring additional procedures.  All of the patient's questions were answered, patient is agreeable to  proceed. Consent signed and in IR.     Thank you for this interesting consult.  I greatly enjoyed meeting Robert Erickson and look forward to participating in their care.  A copy of this report was sent to the requesting provider on this date.  Electronically Signed: Darel Ebbs, PA-C 01/02/2024, 9:26 AM   I spent a total of 40 Minutes    in face to face in clinical consultation, greater than 50% of which was counseling/coordinating care for left chest tube placement.   This chart was dictated using voice recognition software.  Despite best efforts to proofread,  errors can occur which can change the documentation meaning.

## 2024-01-02 NOTE — ED Notes (Signed)
 RT called about changing dressing for trach closure.

## 2024-01-02 NOTE — Procedures (Signed)
  Procedure:  FLuoro guided left chest tube placment 31f Preprocedure diagnosis: The encounter diagnosis was Pneumothorax, left. Postprocedure diagnosis: same EBL:    minimal Complications:   none immediate  See full dictation in [employer].  Nicky Barrack MD Main # [phone] Pager  [phone] Mobile [phone]

## 2024-01-02 NOTE — Consult Note (Signed)
 Psychiatric consult receive for underlying depression and anxiety and uncontrolled.  Psych consult was placed by Dr. Sundil.  Patient recently seen and assessed following chest tube placement by IR.  He is alert and oriented x 4, well, cooperative, very pleasant and willing to engage with provider.  Patient is tremulous and at baseline, and is noted to be lying in the dark permission given to turn on lights. Patient reports history of chronic anxiety and panic disorder that has been managed with Wellbutrin and Celexa.  He reports compliance of this medication.  He is currently being manage outpatient by Dr. Dania Dupre, psychiatrist.  He declines further assistance and will referrals for therapy, and states he will follow up with his current psychiatrist.  Patient further denies any acute psychiatric symptoms that warrant inpatient psychiatric consult. Specifically declines suicidal ideations and or suicidal thoughts.  Patient is complaining of anxiety does not want an inpatient psychiatric consult alone. Unless it is so severe to the extent that the patient is a danger to themself or others, or it negatively impacts daily functioning, anxiety is best managed outpatient. I'd be happy to provide resources for outpatient follow-up, however the patient has declined.    -Psychiatric consult to be discontinued at this time. -Recommend continuing current home medications. -TLC messes with follow-up to current outpatient psychiatrist.  No charge-[place] [facility]

## 2024-01-02 NOTE — ED Notes (Signed)
 Pt extremely anxious at this time , shaking and crying

## 2024-01-02 NOTE — Progress Notes (Signed)
 Progress Note   Patient: Robert Erickson WUJ:[id number] DOB: 03/08/1971 DOA: 01/01/2024     1 DOS: the patient was seen and examined on 01/02/2024   Brief hospital course: 53 y.o. 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   Patient has been transferred from [facility].  The chest tube has been removed today morning at the [facility] and patient found to have recurrent pneumothorax so he has been sent to the ED for cardiothoracic/pulmonary evaluation.   Patient reported that he developed pneumonia 1 month ago and being admitted at [facility] however could not recall any details about this hospitalization except he remembers he woke up at [facility] with a tracheostomy and chest tube.  Eventually trach has been decannulated and it has been healing well.  At [facility] patient redeveloped pneumothorax second time (unknown timeline) and chest tube was placed has eventually removed today 01/01/2023 and excepted plan to discharge to home tomorrow.  Since the removal of the chest tube patient started feeling short of breath, left-sided chest wall pain and very anxious.  Had a x-ray which showed development of pneumothorax-this is the third time.  Assessment and Plan: Tension pneumothorax History of recurrent pneumothorax History of tracheostomy s/p decannulation -Patient taking chest tube placement by IR prior to my examination.   - Felt immediate relief afterwards - CTS consulted by admitting physician, but unable to get to patietn.  PCCM also consulted, recommended IR for CT placement.   - patient now on RA, breathing well, maintaining sats well.   - this is now his 3rd PTX.  Will likely need  more definitive treatment.  - will reach back out to CTS to ensure they know about patient.  He's currently very stable and doing well s/p chest tube.  - admitted to tele  - will hold on [facility] at this time as patient in ambulatory as we await input on any further management   Reactive leukocytosis -WBC count 13 on admit, now resolved.    Essential hypertension -Currently not any blood pressure regimen. - likely secondary to anxiety, will watch    Generalized anxiety disorder Depression Insomnia -Psych consulted by admitting physician - very anxious prior to placement of CT - still some notable anxiety/tremor but much improved from prior reports - will stop welbutrin started on admit as this often worsens anxiety.  Pt on lexapro outpt, will restart this at outpt dose   ADHD - Continue Adderall 10 mg twice daily.  TOC: - patient adamantly against returning to [facility].   - Will wait to see long-term plan and then decide on next disposition steps.         Subjective: Patient able to breathe much more freely.  On RA.  Still slightly tremulous but awake and alert and no current complaints.  No further CP except right at site of chest tube.    Physical Exam: Vitals:   01/02/24 1330 01/02/24 1515 01/02/24 1554 01/02/24 1600  BP: 118/81 111/76 113/87   Pulse: 76 82 80   Resp: 16 14 19    Temp:    98.7 F (37.1 C)  TempSrc:    Oral  SpO2: 99% 97% 95%   Weight:      Height:       Gen:  Alert, cooperative  patient who appears stated age in no acute distress.  Vital signs reviewed. Cardiac:  Regular rate and rhythm without murmur auscultated.  Good S1/S2. Lungs:  Clear throughout except some crackles Left side mid-lungs.   Abd:  Soft/nondistended/nontender.  Good bowel sounds throughout all four quadrants.  No masses noted.  Ext:  No clubbing/cyanosis/erythema.  No edema noted bilateral lower extremities.   Neuro:  Oriented x 4 Psych: mildly anxious appearing.  Slightly  tremulous.  Normal speech pattern, good affect.   Data Reviewed:    Family Communication: none at bedside  Disposition: Status is: Inpatient Remains inpatient appropriate   Planned Discharge Destination:  unclear at this time    Time spent: 35 minutes  Author: Trenton Frock, MD 01/02/2024 4:07 PM  For on call review [email or web address].

## 2024-01-02 NOTE — Progress Notes (Signed)
 RT called to bedside to assess pt stoma site and assess dressing. RT arrived at bedside pt on RA, SpO2 100%, no increased WOB and vitals stable. Pt stated he was de cannulated 5 days ago from [facility]. Pt gauze at stoma site were soiled. RT removed soiled guaze, pt stoma is still open. RT placed a new pressure dressing. Pt tolerated well, RN aware.

## 2024-01-02 NOTE — Plan of Care (Signed)
  Problem: Education: Goal: Knowledge of General Education information will improve Description: Including pain rating scale, medication(s)/side effects and non-pharmacologic comfort measures Outcome: Not Progressing   Problem: Health Behavior/Discharge Planning: Goal: Ability to manage health-related needs will improve Outcome: Not Progressing   Problem: Clinical Measurements: Goal: Respiratory complications will improve Outcome: Not Progressing   Problem: Nutrition: Goal: Adequate nutrition will be maintained Outcome: Not Progressing   Problem: Coping: Goal: Level of anxiety will decrease Outcome: Not Progressing   Problem: Pain Managment: Goal: General experience of comfort will improve and/or be controlled Outcome: Not Progressing

## 2024-01-03 ENCOUNTER — Inpatient Hospital Stay (HOSPITAL_COMMUNITY)

## 2024-01-03 DIAGNOSIS — J449 Chronic obstructive pulmonary disease, unspecified: Secondary | ICD-10-CM

## 2024-01-03 DIAGNOSIS — J939 Pneumothorax, unspecified: Secondary | ICD-10-CM

## 2024-01-03 LAB — CBC
HCT: 39.3 % (ref 39.0–52.0)
Hemoglobin: 12.3 g/dL — ABNORMAL LOW (ref 13.0–17.0)
MCH: 29.1 pg (ref 26.0–34.0)
MCHC: 31.3 g/dL (ref 30.0–36.0)
MCV: 93.1 fL (ref 80.0–100.0)
Platelets: 262 10*3/uL (ref 150–400)
RBC: 4.22 MIL/uL (ref 4.22–5.81)
RDW: 12.8 % (ref 11.5–15.5)
WBC: 9.3 10*3/uL (ref 4.0–10.5)
nRBC: 0 % (ref 0.0–0.2)

## 2024-01-03 LAB — BASIC METABOLIC PANEL WITH GFR
Anion gap: 11 (ref 5–15)
BUN: 8 mg/dL (ref 6–20)
CO2: 24 mmol/L (ref 22–32)
Calcium: 8.8 mg/dL — ABNORMAL LOW (ref 8.9–10.3)
Chloride: 105 mmol/L (ref 98–111)
Creatinine, Ser: 1.1 mg/dL (ref 0.61–1.24)
GFR, Estimated: >60 mL/min (ref >60)
Glucose, Bld: 95 mg/dL (ref 70–99)
Potassium: 4.1 mmol/L (ref 3.5–5.1)
Sodium: 140 mmol/L (ref 135–145)

## 2024-01-03 LAB — MISC LABCORP TEST (SEND OUT): Labcorp test code: 83935

## 2024-01-03 LAB — MRSA NEXT GEN BY PCR, NASAL: MRSA by PCR Next Gen: DETECTED — AB

## 2024-01-03 MED ORDER — CHLORHEXIDINE GLUCONATE CLOTH 2 % EX PADS
6.0000 | MEDICATED_PAD | Freq: Every day | CUTANEOUS | Status: DC
  Administered 2024-01-03 – 2024-01-05 (×3): 6 via TOPICAL

## 2024-01-03 MED ORDER — MORPHINE SULFATE (PF) 2 MG/ML IV SOLN
4.0000 mg | Freq: Once | INTRAVENOUS | Status: AC
  Administered 2024-01-03: 4 mg via INTRAVENOUS
  Filled 2024-01-03: qty 2

## 2024-01-03 MED ORDER — MUPIROCIN 2 % EX OINT
1.0000 | TOPICAL_OINTMENT | Freq: Two times a day (BID) | CUTANEOUS | Status: DC
Start: 2024-01-03 — End: 2024-01-06
  Administered 2024-01-03 – 2024-01-05 (×5): 1 via NASAL
  Filled 2024-01-03: qty 22

## 2024-01-03 NOTE — Progress Notes (Signed)
 Stoma cleaned and dressing change. No respiratory distress noted at this time.    01/03/24 2057  Respiratory Assessment  Assessment Type Pre-treatment  Respiratory Pattern Regular;Unlabored  Chest Assessment Chest expansion symmetrical  Cough None  Bilateral Breath Sounds Diminished  Oxygen Therapy/Pulse Ox  O2 Device Room Air  O2 Therapy Room air  SpO2 93 %  Respiratory  Airway LDA Stoma

## 2024-01-03 NOTE — Progress Notes (Addendum)
 Progress Note   Patient: Robert Erickson WGN:[id number] DOB: [birth year] DOA: 01/01/2024     2 DOS: the patient was seen and examined on 01/03/2024   Brief hospital course: 53 y.o. 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   Patient has been transferred from [facility].  The chest tube has been removed today morning at the LTAC and patient found to have recurrent pneumothorax so he has been sent to the ED for cardiothoracic/pulmonary evaluation.   Patient reported that he developed pneumonia 1 month ago and being admitted at [facility] however could not recall any details about this hospitalization except he remembers he woke up at [facility] with a tracheostomy and chest tube.  Eventually trach has been decannulated and it has been healing well.  At [facility] patient redeveloped pneumothorax second time (unknown timeline) and chest tube was placed has eventually removed today 01/01/2023 and excepted plan to discharge to home tomorrow.  Since the removal of the chest tube patient started feeling short of breath, left-sided chest wall pain and very anxious.  Had a x-ray which showed development of pneumothorax-this is the third time.  Assessment and Plan: Tension pneumothorax History of recurrent pneumothorax History of tracheostomy s/p decannulation -Much more pain today than yesterday.  I saw him in ED about an hour or so after CT placement, and he was sitting up in bed in no distress, breathing easily.   - Now tripoding against back of bed, increased RR, labored breathing.  O2 sats are 96-98% on RA.  CT in place.  Complaining of both pain and difficulty breathing.  Very anxious  appearing - has good breath sounds throughout, including Left lower base of lung near tube.   - CXR imaging from this AM pending. - I've called CTS to see patient to discuss definitive treatment.   - for today, I'm worried about kink or malfunction in tube due to amount of pain and respiratory distress he's in currently.  Good BS are reassuring.   - 1 time dose morphine 4 mg to help with air hunger and pain relief.    Reactive leukocytosis -WBC count 13 on admit, now resolved.    Essential hypertension -Currently not any blood pressure regimen, diastolic elevated in ER. - BP now lower this AM.  No antihypertensives - Trachea midline, pulse good.  Watch for dropping blood pressure in light of known PTX   Generalized anxiety disorder/Depression/Insomnia -Psych consulted by admitting physician - signed off.   - very anxious prior to placement of CT, then much more relaxed and conversant by time of my exam.   - now very anxious again with increased respiratory rate.   - continue home meds, await CXR read.    ADHD - Continue Adderall 10 mg twice daily.  TOC: - patient adamantly against returning to [facility].   - Will wait to see long-term plan and then decide on next disposition steps.  Working with TOC  PPX:  - holding AC in case of procedures.  Apply SCDs     Subjective: Patient in more distress today than yesterday.  "Feels like they're constantly yanking out my tube."  Describes sharp stabbing pain in Left side.  No bleeding.  Able to take full breaths, just painful when breathing.  Didn't eat  breakfast b/c not hungry     Physical Exam: Vitals:   01/03/24 0018 01/03/24 0340 01/03/24 0747 01/03/24 0858  BP: 105/79 100/69 94/75   Pulse:   80   Resp: 20 18 20    Temp: (!) 97.5 F (36.4 C) (!) 97.5 F (36.4 C) 97.6 F (36.4 C)   TempSrc: Oral Oral Oral   SpO2: 96% 96% 94% 94%  Weight:  81.3 kg    Height:       Gen:  Alert, cooperative patient who appears stated age  in mild distress, sitting up in bed, elbows propped against back of bed.  Vital signs reviewed. Neck:  Trachea midline, no crepitus Cardiac:  Regular rate and rhythm.  No pallor throughout.  Tele leads have fallen off Lungs:  Increased resp rate/tachypneic.  Speaking in full sentences.  Clear throughout with good BS surrounding CT site.    Abd:  Soft/nondistended/nontender.   Ext:  No clubbing/cyanosis/erythema.  No edema noted bilateral lower extremities.   Neuro:  Oriented x 4 Psych: Anxious appearing, Normal speech pattern, good affect.    Family Communication: none at bedside  Disposition: Status is: Inpatient Remains inpatient appropriate   Planned Discharge Destination:  unclear at this time    Time spent: 35 minutes  Author: Trenton Frock, MD 01/03/2024 10:45 AM  For on call review [email or web address].

## 2024-01-03 NOTE — Consult Note (Signed)
 NAME:  Robert Erickson, MRN:  [id number], DOB:  12/12/1970, LOS: 2 ADMISSION DATE:  01/01/2024, CONSULTATION DATE:  01/01/2024 REFERRING MD:  Cheron Schaumann, PA-C, CHIEF COMPLAINT:  Pneumonthorax   History of Present Illness:  53 y/o male with PMH for IBS, GERD, HCL, Depression, Asthma, renal stone and severe anxiety who apparently had pneumonia about 1 month ago and then eventually was d/c to [facility] and had a trach and at some point had a PTX requiring chest tube.  The chest tube was removed today in preparation for him to be d/c home.  However, post chest tube, he re-developed a left sided PTX prompting him to be sent to [facility]. Cxr at [facility] confirming a moderate sized left PTX.  Patient's vital otherwise stable as is patient despite his significant anxiety.    Pertinent  Medical History  S/p Trach S/p Left chest tube Anxiety/Depression IBD GERD Pneumonia Significant Hospital Events: Including procedures, antibiotic start and stop dates in addition to other pertinent events   4/15 Admit for PTX 4/16 Chest tube placed by IR  Interim History / Subjective:   Chest tube to suction, no air leak noted CT Surgery at bedside, no plan for surgery at this time Patient feeling ok  Objective   Blood pressure 111/83, pulse 100, temperature 97.6 F (36.4 C), temperature source Oral, resp. rate 20, height 5\' 10"  (1.778 m), weight 81.3 kg, SpO2 94%.        Intake/Output Summary (Last 24 hours) at 01/03/2024 1506 Last data filed at 01/03/2024 1300 Gross per 24 hour  Intake 717 ml  Output 2331 ml  Net -1614 ml   Filed Weights   01/01/24 1924 01/03/24 0340  Weight: 120.7 kg 81.3 kg   Examination: General: no acute distress HENT: PERRLA no icterus, EOMI, trach stoma almost closed Lungs: diminished breath sounds, no wheezing. Chest tube in place, no air leak on suction Cardiovascular: reg s1s2 no murmurs or gallops Abdomen: soft nt nd bs pos no guarding Extremities: no cyanosis, clubbing or  edema Neuro: AAOx3 CN II-Xii Grossly intact  Resolved Hospital Problem list   N/a  Assessment & Plan:  Left Pneumothorax - continue chest tube to suction - No air leak noted today - CXR tomorrow, if no air leak place on water seal - no surgical intervention planned at this time  COPD/Emphysema - continue dulera twice daily - prn nebs  PCCM will continue to follow  Best Practice (right click and "Reselect all SmartList Selections" daily)   Per primary team   Labs   CBC: Recent Labs  Lab 01/01/24 1940 01/01/24 1942 01/02/24 0555 01/03/24 0314  WBC 13.1*  --  8.7 9.3  NEUTROABS 9.0*  --   --   --   HGB 14.6 15.0 13.4 12.3*  HCT 45.2 44.0 41.8 39.3  MCV 92.1  --  93.9 93.1  PLT 351  --  278 262    Basic Metabolic Panel: Recent Labs  Lab 01/01/24 1940 01/01/24 1942 01/02/24 0555 01/03/24 0314  NA 138 138 137 140  K 3.9 3.9 3.2* 4.1  CL 106  --  107 105  CO2 21*  --  24 24  GLUCOSE 95  --  89 95  BUN 13  --  11 8  CREATININE 0.89  --  0.85 1.10  CALCIUM 9.7  --  8.9 8.8*   GFR: Estimated Creatinine Clearance: 81.1 mL/min (by C-G formula based on SCr of 1.1 mg/dL). Recent Labs  Lab 01/01/24 1940 01/02/24  0555 01/03/24 0314  WBC 13.1* 8.7 9.3    Liver Function Tests: Recent Labs  Lab 01/01/24 1940 01/02/24 0555  AST 19 16  ALT 38 29  ALKPHOS 113 86  BILITOT 0.7 0.7  PROT 7.6 6.3*  ALBUMIN 3.3* 2.8*   No results for input(s): "LIPASE", "AMYLASE" in the last 168 hours. No results for input(s): "AMMONIA" in the last 168 hours.  ABG    Component Value Date/Time   HCO3 20.8 01/01/2024 1942   TCO2 22 01/01/2024 1942   O2SAT 63 01/01/2024 1942     Coagulation Profile: Recent Labs  Lab 01/01/24 1940  INR 1.0    Cardiac Enzymes: No results for input(s): "CKTOTAL", "CKMB", "CKMBINDEX", "TROPONINI" in the last 168 hours.  HbA1C: No results found for: "HGBA1C"  CBG: No results for input(s): "GLUCAP" in the last 168 hours.          Duaine German, MD [facility] Office: [phone]   See Amion for personal pager PCCM on call pager [phone] until 7pm. Please call Elink 7p-7a. [phone]

## 2024-01-03 NOTE — Plan of Care (Signed)
  Problem: Elimination: Goal: Will not experience complications related to urinary retention Outcome: Progressing   Problem: Safety: Goal: Ability to remain free from injury will improve Outcome: Progressing   Problem: Education: Goal: Knowledge of General Education information will improve Description: Including pain rating scale, medication(s)/side effects and non-pharmacologic comfort measures Outcome: Not Progressing   Problem: Health Behavior/Discharge Planning: Goal: Ability to manage health-related needs will improve Outcome: Not Progressing   Problem: Clinical Measurements: Goal: Respiratory complications will improve Outcome: Not Progressing   Problem: Activity: Goal: Risk for activity intolerance will decrease Outcome: Not Progressing   Problem: Coping: Goal: Level of anxiety will decrease Outcome: Not Progressing   Problem: Pain Managment: Goal: General experience of comfort will improve and/or be controlled Outcome: Not Progressing

## 2024-01-03 NOTE — Consult Note (Signed)
 [address]       Robert Erickson [id number]             [phone]      Cardiothoracic Surgery Consultation  Reason for Consult: Recurrent right pneumothorax Referring Physician: Payton Mccallum, MD  Robert Erickson is an 53 y.o. male.  HPI:   The patient is a 53 year old gentleman with a history of smoking and COPD, hypertension, hypercholesterolemia, OSA on CPAP, anxiety and depression followed by psychiatry, and IBS who was admitted on 01/01/2024 from [facility] with a recurrent left pneumothorax.  The patient reportedly developed a pneumonia about 1 month ago and was admitted to [facility].  He does not remember any details about that hospitalization but said that he woke up at [facility] with a tracheostomy and a left chest tube.  His tracheostomy was removed about 1 week ago.  His chest tube was removed on 01/01/2023 at [facility] with plans to discharge him home the following day.  After removal of the chest tube he developed shortness of breath and left-sided chest pain and a chest x-ray showed recurrent left pneumothorax which was apparently the third time.  A CT scan of the chest in the emergency room showed a 15 to 20% left pneumothorax that was slightly smaller than the pneumothorax seen on his prior CT scan on 11/30/2023.  There was a small amount of pneumomediastinum and left lateral chest wall subcutaneous emphysema.  The patient has moderate to advanced emphysema with blebs and both upper lobes and a large bleb in the superior segment of the left lower lobe.  He had a fluoroscopic guided left chest tube placement yesterday by interventional radiology with complete resolution of the pneumothorax.  At the present time he denies any shortness of breath and has mild discomfort from the tube.  He said that the prior chest tube was a large bore tube as big as his finger.  Past Medical History:  Diagnosis Date   Anxiety    Arthritis    Asthma    Depression     Elevated cholesterol    Hypertension    Kidney stone    Obesity    Sleep apnea    does have a cpap machine    Past Surgical History:  Procedure Laterality Date   CHOLECYSTECTOMY     COLONOSCOPY  01/17/2007   Diminuitive colonic polyp status post polypectomy. Minimal sigmoid diverticulosis. Incidental note of small diverticulum in the TI.   IR GUIDED DRAIN W CATHETER PLACEMENT  01/02/2024   TONSILLECTOMY      Family History  Problem Relation Age of Onset   Pancreatic cancer Mother    Lung cancer Maternal Grandfather    Colon cancer Neg Hx    Esophageal cancer Neg Hx    Stomach cancer Neg Hx    Rectal cancer Neg Hx     Social History:  reports that he has been smoking cigarettes. He has never used smokeless tobacco. He reports current alcohol use. He reports that he does not currently use drugs after having used the following drugs: Marijuana.  Allergies:  Allergies  Allergen Reactions   Shellfish Allergy Anaphylaxis   Shellfish-Derived Products Anaphylaxis   Aspirin Rash   Penicillin G Rash    Medications: I have reviewed the patient's current medications. Prior to Admission:  Medications Prior to Admission  Medication Sig Dispense Refill Last Dose/Taking   acetaminophen (TYLENOL) 650 MG CR tablet Take 650 mg by mouth every  6 (six) hours as needed for pain or fever.   12/26/2023   albuterol (PROVENTIL) (2.5 MG/3ML) 0.083% nebulizer solution Take 2.5 mg by nebulization every 6 (six) hours as needed for wheezing or shortness of breath.   12/07/2023   atorvastatin (LIPITOR) 10 MG tablet Take 10 mg by mouth at bedtime.   12/31/2023   buPROPion (WELLBUTRIN SR) 200 MG 12 hr tablet Take 200 mg by mouth daily.   01/01/2024 Morning   docusate sodium (COLACE) 100 MG capsule Take 100 mg by mouth every 12 (twelve) hours.   01/01/2024 Morning   doxazosin (CARDURA) 2 MG tablet Take 2 mg by mouth at bedtime.   12/31/2023   enoxaparin (LOVENOX) 40 MG/0.4ML injection Inject 40 mg into the  skin daily.   01/01/2024 at  9:40 AM   escitalopram (LEXAPRO) 20 MG tablet Take 20 mg by mouth at bedtime.   12/31/2023   HYDROcodone-acetaminophen (NORCO/VICODIN) 5-325 MG tablet Take 1 tablet by mouth every 6 (six) hours as needed for severe pain (pain score 7-10).   01/01/2024 Evening   LORazepam (ATIVAN) 0.5 MG tablet Take 0.5 mg by mouth every 8 (eight) hours.   01/01/2024 Evening   mometasone-formoterol (DULERA) 100-5 MCG/ACT AERO Inhale 2 puffs into the lungs every 12 (twelve) hours.   01/01/2024 Morning   ondansetron (ZOFRAN) 4 MG/2ML SOLN injection Inject 4 mg into the vein every 6 (six) hours as needed for nausea or vomiting.   12/02/2023   pantoprazole (PROTONIX) 40 MG tablet Take 40 mg by mouth daily.   01/01/2024 Morning   polyethylene glycol (MIRALAX / GLYCOLAX) 17 g packet Take 17 g by mouth every 12 (twelve) hours.   01/01/2024 Morning   predniSONE (DELTASONE) 20 MG tablet Take 20 mg by mouth daily.   01/01/2024 Morning   Sodium Phosphates (PHOSPHATE ENEMA RE) Place 133 mLs rectally every 12 (twelve) hours as needed (constipation).   12/28/2023   traZODone (DESYREL) 100 MG tablet Take 100 mg by mouth at bedtime.   12/31/2023   zinc oxide 20 % ointment Apply 1 Application topically daily. To buttocks.   01/01/2024 Morning   amphetamine-dextroamphetamine (ADDERALL) 10 MG tablet 1 tablet daily. (Patient not taking: Reported on 01/02/2024)   Not Taking   clonazePAM (KLONOPIN) 0.5 MG tablet Take 0.5 mg by mouth as directed. Take 1 in the morning and 2 in the afternoon (Patient not taking: Reported on 01/02/2024)   Not Taking   tamsulosin (FLOMAX) 0.4 MG CAPS capsule Take 0.4 mg by mouth daily. (Patient not taking: Reported on 01/02/2024)   Not Taking   Scheduled:  amphetamine-dextroamphetamine  10 mg Oral BID   Chlorhexidine Gluconate Cloth  6 each Topical Daily   clonazePAM  0.5 mg Oral BID   escitalopram  20 mg Oral QHS   feeding supplement  237 mL Oral BID BM   guaiFENesin  600 mg Oral BID    mometasone-formoterol  2 puff Inhalation BID   mupirocin ointment  1 Application Nasal BID   sodium chloride flush  3 mL Intravenous Q12H   Continuous: [id number] **OR** acetaminophen, levalbuterol, morphine injection, ondansetron **OR** ondansetron (ZOFRAN) IV, traZODone Anti-infectives (From admission, onward)    None       Results for orders placed or performed during the hospital encounter of 01/01/24 (from the past 48 hours)  CBC with Differential     Status: Abnormal   Collection Time: 01/01/24  7:40 PM  Result Value Ref Range   WBC 13.1 (H) 4.0 -  10.5 K/uL   RBC 4.91 4.22 - 5.81 MIL/uL   Hemoglobin 14.6 13.0 - 17.0 g/dL   HCT 16.1 09.6 - 04.5 %   MCV 92.1 80.0 - 100.0 fL   MCH 29.7 26.0 - 34.0 pg   MCHC 32.3 30.0 - 36.0 g/dL   RDW 40.9 81.1 - 91.4 %   Platelets 351 150 - 400 K/uL   nRBC 0.0 0.0 - 0.2 %   Neutrophils Relative % 69 %   Neutro Abs 9.0 (H) 1.7 - 7.7 K/uL   Lymphocytes Relative 22 %   Lymphs Abs 2.9 0.7 - 4.0 K/uL   Monocytes Relative 7 %   Monocytes Absolute 0.9 0.1 - 1.0 K/uL   Eosinophils Relative 1 %   Eosinophils Absolute 0.1 0.0 - 0.5 K/uL   Basophils Relative 0 %   Basophils Absolute 0.0 0.0 - 0.1 K/uL   Immature Granulocytes 1 %   Abs Immature Granulocytes 0.11 (H) 0.00 - 0.07 K/uL    Comment: Performed at [facility], [address], [place], [place] [address]  Comprehensive metabolic panel     Status: Abnormal   Collection Time: 01/01/24  7:40 PM  Result Value Ref Range   Sodium 138 135 - 145 mmol/L   Potassium 3.9 3.5 - 5.1 mmol/L   Chloride 106 98 - 111 mmol/L   CO2 21 (L) 22 - 32 mmol/L   Glucose, Bld 95 70 - 99 mg/dL    Comment: Glucose reference range applies only to samples taken after fasting for at least 8 hours.   BUN 13 6 - 20 mg/dL   Creatinine, Ser 6.21 0.61 - 1.24 mg/dL   Calcium 9.7 8.9 - 30.8 mg/dL   Total Protein 7.6 6.5 - 8.1 g/dL   Albumin 3.3 (L) 3.5 - 5.0 g/dL   AST 19 15 - 41 U/L   ALT 38 0 - 44 U/L    Alkaline Phosphatase 113 38 - 126 U/L   Total Bilirubin 0.7 0.0 - 1.2 mg/dL   GFR, Estimated >65 >78 mL/min    Comment: (NOTE) Calculated using the CKD-EPI Creatinine Equation (2021)    Anion gap 11 5 - 15    Comment: Performed at [facility], [address], [place], [place] [address]  Protime-INR     Status: None   Collection Time: 01/01/24  7:40 PM  Result Value Ref Range   Prothrombin Time 13.0 11.4 - 15.2 seconds   INR 1.0 0.8 - 1.2    Comment: (NOTE) INR goal varies based on device and disease states. Performed at [facility], [address], [place], [place] [address]   APTT     Status: None   Collection Time: 01/01/24  7:40 PM  Result Value Ref Range   aPTT 30 24 - 36 seconds    Comment: Performed at [facility], [address], [place], [place] [address]  I-Stat venous blood gas, [facility], [facility], [facility])     Status: Abnormal   Collection Time: 01/01/24  7:42 PM  Result Value Ref Range   pH, Ven 7.548 (H) 7.25 - 7.43   pCO2, Ven 23.9 (L) 44 - 60 mmHg   pO2, Ven 27 (LL) 32 - 45 mmHg   Bicarbonate 20.8 20.0 - 28.0 mmol/L   TCO2 22 22 - 32 mmol/L   O2 Saturation 63 %   Acid-Base Excess 0.0 0.0 - 2.0 mmol/L   Sodium 138 135 - 145 mmol/L  Potassium 3.9 3.5 - 5.1 mmol/L   Calcium, Ion 1.10 (L) 1.15 - 1.40 mmol/L   HCT 44.0 39.0 - 52.0 %   Hemoglobin 15.0 13.0 - 17.0 g/dL   Sample type VENOUS    Comment NOTIFIED PHYSICIAN   Miscellaneous [facility] test (send-out)     Status: None   Collection Time: 01/02/24  5:53 AM  Result Value Ref Range   [facility] test code [id number]    [facility] test name [id number]    Source ([facility]) SERUM     Comment: Performed at [facility], [address] [place], [place] [address]   Misc [facility] result COMMENT     Comment: (NOTE) Test Ordered: [id number] HIV Ab/p24 Ag with Reflex HIV Ab/p24 Ag Screen           Note:                     BN     Non Reactive                                                  Reference Range: Non  Reactive                          HIV-1/HIV-2 antibodies and HIV-1 p24 antigen were NOT detected. There is no laboratory evidence of HIV infection. HIV Negative Performed At: [facility] [address] [place], [place] [id number] Jolene Schimke MD HY:[id number]   Comprehensive metabolic panel     Status: Abnormal   Collection Time: 01/02/24  5:55 AM  Result Value Ref Range   Sodium 137 135 - 145 mmol/L   Potassium 3.2 (L) 3.5 - 5.1 mmol/L   Chloride 107 98 - 111 mmol/L   CO2 24 22 - 32 mmol/L   Glucose, Bld 89 70 - 99 mg/dL    Comment: Glucose reference range applies only to samples taken after fasting for at least 8 hours.   BUN 11 6 - 20 mg/dL   Creatinine, Ser 9.52 0.61 - 1.24 mg/dL   Calcium 8.9 8.9 - 84.1 mg/dL   Total Protein 6.3 (L) 6.5 - 8.1 g/dL   Albumin 2.8 (L) 3.5 - 5.0 g/dL   AST 16 15 - 41 U/L   ALT 29 0 - 44 U/L   Alkaline Phosphatase 86 38 - 126 U/L   Total Bilirubin 0.7 0.0 - 1.2 mg/dL   GFR, Estimated >32 >44 mL/min    Comment: (NOTE) Calculated using the CKD-EPI Creatinine Equation (2021)    Anion gap 6 5 - 15    Comment: Performed at [facility], [address] [place], [place] [address]  CBC     Status: None   Collection Time: 01/02/24  5:55 AM  Result Value Ref Range   WBC 8.7 4.0 - 10.5 K/uL   RBC 4.45 4.22 - 5.81 MIL/uL   Hemoglobin 13.4 13.0 - 17.0 g/dL   HCT 25.3 66.4 - 40.3 %   MCV 93.9 80.0 - 100.0 fL   MCH 30.1 26.0 - 34.0 pg   MCHC 32.1 30.0 - 36.0 g/dL   RDW 47.4 25.9 - 56.3 %   Platelets 278 150 - 400 K/uL   nRBC 0.0 0.0 - 0.2 %    Comment: Performed at [facility], [address] [place], [place] [address]  MRSA Next Gen by PCR, Nasal     Status: Abnormal   Collection Time: 01/02/24 10:49 PM   Specimen: Nasal Mucosa; Nasal Swab  Result Value Ref Range   MRSA by PCR Next Gen DETECTED (A) NOT DETECTED    Comment: RESULT CALLED TO, READ BACK BY AND VERIFIED WITH: D DONSON RN 01/03/2024 @ 0106 BY AB (NOTE) The  GeneXpert MRSA Assay (FDA approved for NASAL specimens only), is one component of a comprehensive MRSA colonization surveillance program. It is not intended to diagnose MRSA infection nor to guide or monitor treatment for MRSA infections. Test performance is not FDA approved in patients less than 51 years old. Performed at [facility], [address] [place], [place] [address]   Basic metabolic panel with GFR     Status: Abnormal   Collection Time: 01/03/24  3:14 AM  Result Value Ref Range   Sodium 140 135 - 145 mmol/L   Potassium 4.1 3.5 - 5.1 mmol/L   Chloride 105 98 - 111 mmol/L   CO2 24 22 - 32 mmol/L   Glucose, Bld 95 70 - 99 mg/dL    Comment: Glucose reference range applies only to samples taken after fasting for at least 8 hours.   BUN 8 6 - 20 mg/dL   Creatinine, Ser 0.98 0.61 - 1.24 mg/dL   Calcium 8.8 (L) 8.9 - 10.3 mg/dL   GFR, Estimated >11 >91 mL/min    Comment: (NOTE) Calculated using the CKD-EPI Creatinine Equation (2021)    Anion gap 11 5 - 15    Comment: Performed at [facility], [address], [place], [place] [address]  CBC     Status: Abnormal   Collection Time: 01/03/24  3:14 AM  Result Value Ref Range   WBC 9.3 4.0 - 10.5 K/uL   RBC 4.22 4.22 - 5.81 MIL/uL   Hemoglobin 12.3 (L) 13.0 - 17.0 g/dL   HCT 56.2 13.0 - 86.5 %   MCV 93.1 80.0 - 100.0 fL   MCH 29.1 26.0 - 34.0 pg   MCHC 31.3 30.0 - 36.0 g/dL   RDW 78.4 69.6 - 29.5 %   Platelets 262 150 - 400 K/uL   nRBC 0.0 0.0 - 0.2 %    Comment: Performed at [facility], [address] [place], [place] [address]    IR Guided Drain W Catheter Placement Result Date: 01/02/2024 INDICATION: Recurrent left pneumothorax post removal of chest tube EXAM: FLUOROSCOPY GUIDED CHEST TUBE PLACEMENT MEDICATIONS: No periprocedural antibiotics were indicated ANESTHESIA/SEDATION: Intravenous Fentanyl and Versed 3mg  were administered by RN during a total moderate (conscious) sedation time of 13  minutes; the patient's level of consciousness and physiological / cardiorespiratory status were monitored continuously by radiology RN under my direct supervision. COMPLICATIONS: None immediate. PROCEDURE: Informed written consent was obtained from the patient after a thorough discussion of the procedural risks, benefits and alternatives. All questions were addressed. Maximal Sterile Barrier Technique was utilized including caps, mask, sterile gowns, sterile gloves, sterile drape, hand hygiene and skin antiseptic. A timeout was performed prior to the initiation of the procedure. Under fluoroscopic guidance, the residual left pneumothorax was identified. Appropriate skin entry site was determined and marked. Region was prepped with chlorhexidine, draped in usual sterile fashion, infiltrated locally with 1% lidocaine. Percutaneous entry needle was advanced into the pleural space. Gas could be aspirated. Amplatz guidewire advanced easily. Tract dilated to facilitate placement of a  14 French pigtail drain catheter. Catheter was placed to Pleur-Evac suction device. Confirmatory fluoroscopic spot image demonstrated good catheter position. The catheter was secured externally with 0 Prolene suture and StatLock and a sterile dressing applied. The patient tolerated the procedure well. IMPRESSION: 1. Technically successful fluoroscopic-guided left chest tube placement. Electronically Signed   By: Nicoletta Barrier M.D.   On: 01/02/2024 14:45   DG CHEST PORT 1 VIEW Result Date: 01/02/2024 CLINICAL DATA:  Pneumothorax EXAM: PORTABLE CHEST 1 VIEW COMPARISON:  Yesterday FINDINGS: Left pneumothorax is unchanged, seen from apex to base and measuring up to nearly 3 cm in thickness laterally at the left base. Mild underlying pulmonary opacity from atelectasis by prior CT. Chest wall emphysema on the left. Clear right lung. Normal heart size and mediastinal contours. IMPRESSION: Unchanged moderate left pneumothorax when compared to  yesterday. Electronically Signed   By: Ronnette Coke M.D.   On: 01/02/2024 08:19   CT CHEST WO CONTRAST Result Date: 01/01/2024 CLINICAL DATA:  Chest injury, pneumothorax EXAM: CT CHEST WITHOUT CONTRAST TECHNIQUE: Multidetector CT imaging of the chest was performed following the standard protocol without IV contrast. RADIATION DOSE REDUCTION: This exam was performed according to the departmental dose-optimization program which includes automated exposure control, adjustment of the mA and/or kV according to patient size and/or use of iterative reconstruction technique. COMPARISON:  CT 11/30/2023.  Chest x-ray today. FINDINGS: Cardiovascular: Heart is normal size. Aorta is normal caliber. Mediastinum/Nodes: No mediastinal, hilar, or axillary adenopathy. Small amount of pneumomediastinum noted in the anterior superior mediastinum. Trachea and esophagus are unremarkable. Thyroid unremarkable. Stoma noted in the lower neck. Lungs/Pleura: Moderate to severe centrilobular and paraseptal emphysema. Bulla in the apices bilaterally. Subsegmental atelectasis in the lower lobes bilaterally. Small to moderate left pneumothorax noted, likely approximately 15-20%. This is slightly smaller than the pneumothorax seen on CT from 11/30/2023. No effusions. Upper Abdomen: No acute findings Musculoskeletal: Left subcutaneous emphysema. No acute bony abnormality. IMPRESSION: Recurrent 15-20% left pneumothorax. This is slightly smaller than the pneumothorax seen on prior CT from 11/30/2023. Small amount of pneumomediastinum noted. Moderate to advanced centrilobular and paraseptal emphysema with upper lobe bulla bilaterally. Electronically Signed   By: Janeece Mechanic M.D.   On: 01/01/2024 23:28   DG Chest Portable 1 View Result Date: 01/01/2024 CLINICAL DATA:  Follow-up left pneumothorax EXAM: PORTABLE CHEST 1 VIEW COMPARISON:  12/01/2023 FINDINGS: Pigtail catheter has been removed on the left. There is a recurrent pneumothorax  identified along the apex and laterally on the left. Right lung remains clear. Cardiac shadow is within normal limits. No bony abnormality is seen. IMPRESSION: Recurrent and increased left-sided pneumothorax when compared with the prior exam. Critical Value/emergent results were called by telephone at the time of interpretation on 01/01/2024 at 8:50 pm to Dr. Annita Erickson , who verbally acknowledged these results. Electronically Signed   By: Violeta Grey M.D.   On: 01/01/2024 20:52    Review of Systems  Respiratory:  Negative for cough and shortness of breath.   Cardiovascular:  Negative for chest pain.   Blood pressure 94/75, pulse 80, temperature 97.6 F (36.4 C), temperature source Oral, resp. rate 20, height 5\' 10"  (1.778 m), weight 81.3 kg, SpO2 94%. Physical Exam Constitutional:      Appearance: Normal appearance.  Cardiovascular:     Rate and Rhythm: Normal rate and regular rhythm.     Pulses: Normal pulses.     Heart sounds: Normal heart sounds. No murmur heard. Pulmonary:     Effort:  Pulmonary effort is normal.     Breath sounds: Normal breath sounds. No wheezing or rhonchi.     Comments: Left pleural pigtail catheter in place with a small amount of serous drainage and no airleak or tidaling.  It is currently on 20 cm water suction. Neurological:     Mental Status: He is alert.     Assessment/Plan:  He has had recurrent left pneumothorax shortly after removing the prior chest tube.  It is not clear if this is related to air being sucked in when the tube was removed or recurrent air leak related to blebs.  This has been successfully treated with a pigtail catheter with complete reexpansion of the lung and no airleak seen on suction.  I think this can be treated nonoperatively with the pigtail catheter in place.  I would leave this on suction today and if his chest x-ray is unchanged in the morning and there is no airleak that I would place him on waterseal for a day and then  consider clamping the tube for a day before removing it.  If he develops an air leak or recurrent pneumothorax then he would require operative treatment either thoracoscopically or robotically to staple the largest blebs and perform pleurodesis.  Even if this can be managed without surgery at this time there is still a significant chance that he may have recurrent left pneumothorax that would require surgical treatment in the future.  Since he is just getting out of a prolonged hospitalization at [facility] with tracheostomy I think it would be best to treat this conservatively at this time if possible.  I discussed all this with him and he is in agreement.  Pulmonary medicine is currently seeing him and will decide on the timing of chest tube removal.  We will be happy to see him again if the need arises.  Bartley Lightning 01/03/2024, 11:55 AM

## 2024-01-03 NOTE — Progress Notes (Signed)
 Chief Complaint: Patient was seen today for left chest tube  Referring Physician(s): Dr. Rozann Lesches  Supervising Physician: Irish Lack  Patient Status: [facility] - In-pt  Subjective: S/p left chest drain placed yesterday for recurrent PTX. Feels much better aside from some soreness near chest tube site. Denies SOB.  Objective: Physical Exam: BP 94/75 (BP Location: Left Arm)   Pulse 80   Temp 97.6 F (36.4 C) (Oral)   Resp 20   Ht 5\' 10"  (1.778 m)   Wt 179 lb 3.7 oz (81.3 kg)   SpO2 94%   BMI 25.72 kg/m  (L)chest tube intact, site clean. Serous output, no air leak seen.   Current Facility-Administered Medications:    acetaminophen (TYLENOL) tablet 650 mg, 650 mg, Oral, Q6H PRN **OR** acetaminophen (TYLENOL) suppository 650 mg, 650 mg, Rectal, Q6H PRN, Janalyn Shy, Subrina, MD   amphetamine-dextroamphetamine (ADDERALL) tablet 10 mg, 10 mg, Oral, BID, Sundil, Subrina, MD, 10 mg at 01/03/24 6045   Chlorhexidine Gluconate Cloth 2 % PADS 6 each, 6 each, Topical, Daily, Mansy, Jan A, MD   clonazePAM [facility]) tablet 0.5 mg, 0.5 mg, Oral, BID, Sundil, Subrina, MD, 0.5 mg at 01/03/24 4098   escitalopram (LEXAPRO) tablet 20 mg, 20 mg, Oral, QHS, Tobey Grim, MD, 20 mg at 01/02/24 2118   feeding supplement (ENSURE ENLIVE / ENSURE PLUS) liquid 237 mL, 237 mL, Oral, BID BM, Tobey Grim, MD   guaiFENesin [facility]) 12 hr tablet 600 mg, 600 mg, Oral, BID, Sundil, Subrina, MD, 600 mg at 01/03/24 1191   levalbuterol (XOPENEX) nebulizer solution 0.63 mg, 0.63 mg, Nebulization, Q6H PRN, Sundil, Subrina, MD   mometasone-formoterol [facility]) 200-5 MCG/ACT inhaler 2 puff, 2 puff, Inhalation, BID, Sundil, Subrina, MD, 2 puff at 01/03/24 0858   morphine (PF) 2 MG/ML injection 1-2 mg, 1-2 mg, Intravenous, Q3H PRN, Janalyn Shy, Subrina, MD, 2 mg at 01/03/24 4782   mupirocin ointment (BACTROBAN) 2 % 1 Application, 1 Application, Nasal, BID, Mansy, Jan A, MD, 1 Application at 01/03/24 0812    ondansetron (ZOFRAN) tablet 4 mg, 4 mg, Oral, Q6H PRN **OR** ondansetron (ZOFRAN) injection 4 mg, 4 mg, Intravenous, Q6H PRN, Janalyn Shy, Subrina, MD, 4 mg at 01/02/24 0644   sodium chloride flush (NS) 0.9 % injection 3 mL, 3 mL, Intravenous, Q12H, Sundil, Subrina, MD, 3 mL at 01/03/24 0813   traZODone (DESYREL) tablet 50 mg, 50 mg, Oral, QHS PRN, Janalyn Shy, Subrina, MD, 50 mg at 01/02/24 2118  Labs: CBC Recent Labs    01/02/24 0555 01/03/24 0314  WBC 8.7 9.3  HGB 13.4 12.3*  HCT 41.8 39.3  PLT 278 262   BMET Recent Labs    01/02/24 0555 01/03/24 0314  NA 137 140  K 3.2* 4.1  CL 107 105  CO2 24 24  GLUCOSE 89 95  BUN 11 8  CREATININE 0.85 1.10  CALCIUM 8.9 8.8*   LFT Recent Labs    01/02/24 0555  PROT 6.3*  ALBUMIN 2.8*  AST 16  ALT 29  ALKPHOS 86  BILITOT 0.7   PT/INR Recent Labs    01/01/24 1940  LABPROT 13.0  INR 1.0     Studies/Results: IR Guided Drain W Catheter Placement Result Date: 01/02/2024 INDICATION: Recurrent left pneumothorax post removal of chest tube EXAM: FLUOROSCOPY GUIDED CHEST TUBE PLACEMENT MEDICATIONS: No periprocedural antibiotics were indicated ANESTHESIA/SEDATION: Intravenous Fentanyl and Versed 3mg  were administered by RN during a total moderate (conscious) sedation time of 13 minutes; the patient's level of consciousness and  physiological / cardiorespiratory status were monitored continuously by radiology RN under my direct supervision. COMPLICATIONS: None immediate. PROCEDURE: Informed written consent was obtained from the patient after a thorough discussion of the procedural risks, benefits and alternatives. All questions were addressed. Maximal Sterile Barrier Technique was utilized including caps, mask, sterile gowns, sterile gloves, sterile drape, hand hygiene and skin antiseptic. A timeout was performed prior to the initiation of the procedure. Under fluoroscopic guidance, the residual left pneumothorax was identified. Appropriate  skin entry site was determined and marked. Region was prepped with chlorhexidine, draped in usual sterile fashion, infiltrated locally with 1% lidocaine. Percutaneous entry needle was advanced into the pleural space. Gas could be aspirated. Amplatz guidewire advanced easily. Tract dilated to facilitate placement of a 14 French pigtail drain catheter. Catheter was placed to Pleur-Evac suction device. Confirmatory fluoroscopic spot image demonstrated good catheter position. The catheter was secured externally with 0 Prolene suture and StatLock and a sterile dressing applied. The patient tolerated the procedure well. IMPRESSION: 1. Technically successful fluoroscopic-guided left chest tube placement. Electronically Signed   By: Nicoletta Barrier M.D.   On: 01/02/2024 14:45   DG CHEST PORT 1 VIEW Result Date: 01/02/2024 CLINICAL DATA:  Pneumothorax EXAM: PORTABLE CHEST 1 VIEW COMPARISON:  Yesterday FINDINGS: Left pneumothorax is unchanged, seen from apex to base and measuring up to nearly 3 cm in thickness laterally at the left base. Mild underlying pulmonary opacity from atelectasis by prior CT. Chest wall emphysema on the left. Clear right lung. Normal heart size and mediastinal contours. IMPRESSION: Unchanged moderate left pneumothorax when compared to yesterday. Electronically Signed   By: Ronnette Coke M.D.   On: 01/02/2024 08:19   CT CHEST WO CONTRAST Result Date: 01/01/2024 CLINICAL DATA:  Chest injury, pneumothorax EXAM: CT CHEST WITHOUT CONTRAST TECHNIQUE: Multidetector CT imaging of the chest was performed following the standard protocol without IV contrast. RADIATION DOSE REDUCTION: This exam was performed according to the departmental dose-optimization program which includes automated exposure control, adjustment of the mA and/or kV according to patient size and/or use of iterative reconstruction technique. COMPARISON:  CT 11/30/2023.  Chest x-ray today. FINDINGS: Cardiovascular: Heart is normal size. Aorta  is normal caliber. Mediastinum/Nodes: No mediastinal, hilar, or axillary adenopathy. Small amount of pneumomediastinum noted in the anterior superior mediastinum. Trachea and esophagus are unremarkable. Thyroid unremarkable. Stoma noted in the lower neck. Lungs/Pleura: Moderate to severe centrilobular and paraseptal emphysema. Bulla in the apices bilaterally. Subsegmental atelectasis in the lower lobes bilaterally. Small to moderate left pneumothorax noted, likely approximately 15-20%. This is slightly smaller than the pneumothorax seen on CT from 11/30/2023. No effusions. Upper Abdomen: No acute findings Musculoskeletal: Left subcutaneous emphysema. No acute bony abnormality. IMPRESSION: Recurrent 15-20% left pneumothorax. This is slightly smaller than the pneumothorax seen on prior CT from 11/30/2023. Small amount of pneumomediastinum noted. Moderate to advanced centrilobular and paraseptal emphysema with upper lobe bulla bilaterally. Electronically Signed   By: Janeece Mechanic M.D.   On: 01/01/2024 23:28   DG Chest Portable 1 View Result Date: 01/01/2024 CLINICAL DATA:  Follow-up left pneumothorax EXAM: PORTABLE CHEST 1 VIEW COMPARISON:  12/01/2023 FINDINGS: Pigtail catheter has been removed on the left. There is a recurrent pneumothorax identified along the apex and laterally on the left. Right lung remains clear. Cardiac shadow is within normal limits. No bony abnormality is seen. IMPRESSION: Recurrent and increased left-sided pneumothorax when compared with the prior exam. Critical Value/emergent results were called by telephone at the time of interpretation on 01/01/2024 at  8:50 pm to Dr. Annita Kindle , who verbally acknowledged these results. Electronically Signed   By: Violeta Grey M.D.   On: 01/01/2024 20:52    Assessment/Plan: Recurrent left PTX S/p left pigtail chest tube yesterday. Emphysema with known apical bulla CXR pending Plan/recs per PCCM   LOS: 2 days   I spent a total of 20 minutes  in face to face in clinical consultation, greater than 50% of which was counseling/coordinating care for left chest tube/PTX  Prudence Brown PA-C 01/03/2024 10:25 AM

## 2024-01-04 ENCOUNTER — Inpatient Hospital Stay (HOSPITAL_COMMUNITY)

## 2024-01-04 DIAGNOSIS — J449 Chronic obstructive pulmonary disease, unspecified: Secondary | ICD-10-CM | POA: Diagnosis not present

## 2024-01-04 DIAGNOSIS — J939 Pneumothorax, unspecified: Secondary | ICD-10-CM | POA: Diagnosis not present

## 2024-01-04 LAB — CBC
HCT: 39.7 % (ref 39.0–52.0)
Hemoglobin: 12.9 g/dL — ABNORMAL LOW (ref 13.0–17.0)
MCH: 30.1 pg (ref 26.0–34.0)
MCHC: 32.5 g/dL (ref 30.0–36.0)
MCV: 92.8 fL (ref 80.0–100.0)
Platelets: 309 10*3/uL (ref 150–400)
RBC: 4.28 MIL/uL (ref 4.22–5.81)
RDW: 12.8 % (ref 11.5–15.5)
WBC: 9.5 10*3/uL (ref 4.0–10.5)
nRBC: 0 % (ref 0.0–0.2)

## 2024-01-04 LAB — BASIC METABOLIC PANEL WITH GFR
Anion gap: 10 (ref 5–15)
BUN: 13 mg/dL (ref 6–20)
CO2: 25 mmol/L (ref 22–32)
Calcium: 8.8 mg/dL — ABNORMAL LOW (ref 8.9–10.3)
Chloride: 104 mmol/L (ref 98–111)
Creatinine, Ser: 1.13 mg/dL (ref 0.61–1.24)
GFR, Estimated: >60 mL/min (ref >60)
Glucose, Bld: 121 mg/dL — ABNORMAL HIGH (ref 70–99)
Potassium: 3.8 mmol/L (ref 3.5–5.1)
Sodium: 139 mmol/L (ref 135–145)

## 2024-01-04 MED ORDER — OXYCODONE-ACETAMINOPHEN 7.5-325 MG PO TABS: 2.0000 | ORAL_TABLET | Freq: Four times a day (QID) | ORAL | Status: DC | PRN

## 2024-01-04 MED ORDER — SENNOSIDES-DOCUSATE SODIUM 8.6-50 MG PO TABS
1.0000 | ORAL_TABLET | Freq: Two times a day (BID) | ORAL | Status: DC
  Administered 2024-01-04 – 2024-01-05 (×2): 1 via ORAL
  Filled 2024-01-04 (×2): qty 1

## 2024-01-04 MED ORDER — DIPHENHYDRAMINE HCL 50 MG/ML IJ SOLN
12.5000 mg | Freq: Once | INTRAMUSCULAR | Status: AC
  Administered 2024-01-04: 12.5 mg via INTRAVENOUS
  Filled 2024-01-04: qty 1

## 2024-01-04 MED ORDER — OXYCODONE-ACETAMINOPHEN 7.5-325 MG PO TABS
1.0000 | ORAL_TABLET | Freq: Four times a day (QID) | ORAL | Status: DC | PRN
  Administered 2024-01-04 – 2024-01-05 (×2): 1 via ORAL
  Filled 2024-01-04: qty 1

## 2024-01-04 MED ORDER — POLYETHYLENE GLYCOL 3350 17 G PO PACK
17.0000 g | PACK | Freq: Every day | ORAL | Status: DC
  Administered 2024-01-04: 17 g via ORAL
  Filled 2024-01-04 (×2): qty 1

## 2024-01-04 NOTE — Evaluation (Signed)
 Physical Therapy Evaluation Patient Details Name: Robert Erickson MRN: [id number] DOB: [birth year] Today's Date: 01/04/2024  History of Present Illness  Robert Erickson is a 53 y.o. male admitted 01/01/24 from [facility] for a recurrent left pneumothorax. Pt presented with severe anxiety, left sided chest wall pain, and shortness of breath. Pt reportedly developed pneumonia about 1 month ago and was admitted at [facility] however could not recall any details about this hospitalization except he remembers he woke up at [facility] with a tracheostomy and chest tube. Chest x-ray showing recurrent and increased left-sided pneumothorax as compared to prior exam. 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   Clinical Impression  Pt admitted with above diagnosis. PTA, pt was ambulating ~2,064ft using RW and taking increased time to perform ADLs while at [facility]. Pt lives with his wife in a one story house that has 8 STE with rail(s). Pt currently with functional limitations due to the deficits listed below (see PT Problem List). He required supervision-CGA for safety with all functional mobility secondary to anxiety, impulsivity, and emotional lability. Pt was hyperactive yet cooperative. He required multi-modal cueing and re-direction to attend to tasks. Pt will benefit from acute skilled PT to increase his independence and safety with mobility to allow discharge. Recommend Home with HHPT to increase safety awareness with mobility and decrease fall risk.       If plan is discharge home, recommend the following: A little help with walking and/or transfers;A little help with bathing/dressing/bathroom;Help with stairs or ramp for entrance   Can travel by private vehicle        Equipment  Recommendations Rolling walker (2 wheels)  Recommendations for Other Services       Functional Status Assessment Patient has had a recent decline in their functional status and demonstrates the ability to make significant improvements in function in a reasonable and predictable amount of time.     Precautions / Restrictions Precautions Precautions: Fall Recall of Precautions/Restrictions: Intact Precaution/Restrictions Comments: L Chest Tube Restrictions Weight Bearing Restrictions Per Provider Order: No      Mobility  Bed Mobility Overal bed mobility: Needs Assistance Bed Mobility: Supine to Sit, Sit to Supine     Supine to sit: Contact guard, HOB elevated Sit to supine: Contact guard assist, HOB elevated   General bed mobility comments: Pt requested help with bed mobility before attempting d/t c/o bed sores. He sat up on L side of bed with increased time and assist at trunk and while scoot towards EOB. Returning to bed pt required assist to bring BLE into bed.    Transfers Overall transfer level: Needs assistance Equipment used: Rolling walker (2 wheels) Transfers: Sit to/from Stand Sit to Stand: Supervision           General transfer comment: Pt stood from lowest bed height. Demonstrated proper hand palcement using RW. No physical assist to power up. Good eccentric control with sitting.    Ambulation/Gait Ambulation/Gait assistance: Supervision Gait Distance (Feet): 550 Feet Assistive device: Rolling walker (2 wheels) Gait Pattern/deviations: Step-through pattern, Drifts right/left Gait velocity: WFL Gait velocity interpretation: 1.31 - 2.62 ft/sec, indicative of limited community ambulator   General Gait Details: Pt ambulated with a reciprocal gait pattern, even weight shift, good foot clearence. He maintained body  inside AD and navigated room/hallway well. Pt intermittently released one hand from RW grip in order  to press button to speak, leading to drifting R/L  in the hallway. VC to increase safety awareness during mobility.  Stairs Stairs: Yes Stairs assistance: Contact guard assist Stair Management: Two rails, Forwards, Step to pattern Number of Stairs: 10 General stair comments: Educated pt on stair training and how his family should be positioned to assist him. Pt ascended leading with RLE and descended leading with LLE. He completed a flight of stairs with BUE support. Pt maintained a quick speed, VC to go slow to improve stability and safety.  Wheelchair Mobility     Tilt Bed    Modified Rankin (Stroke Patients Only)       Balance Overall balance assessment: Mild deficits observed, not formally tested                                           Pertinent Vitals/Pain Pain Assessment Pain Assessment: Faces Faces Pain Scale: Hurts little more Pain Location: insertion of L chest tube Pain Descriptors / Indicators: Discomfort, Aching, Sore, Tender Pain Intervention(s): Monitored during session    Home Living Family/patient expects to be discharged to:: Private residence Living Arrangements: Spouse/significant other Available Help at Discharge: Family;Available PRN/intermittently Type of Home: House Home Access: Stairs to enter Entrance Stairs-Rails: Left;Right;Can reach both Entrance Stairs-Number of Steps: 8   Home Layout: One level Home Equipment: None Additional Comments: No family present and pt reports it has been over 30 days since he has been home, so "it is a little fuzzy."    Prior Function Prior Level of Function : Independent/Modified Independent;Driving             Mobility Comments: Indep without an AD. Pt reports walking 2,041ft using RW at [facility]. Denies fall history. ADLs Comments: Indep with ADLs/IADLs. Pt reports ADLs taking increased time to complete at [facility].     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 Pt had to pause a press button to speak clearly.)    Cognition Arousal: Alert Behavior During Therapy: Anxious, Lability, Impulsive   PT - Cognitive impairments: No family/caregiver present to determine baseline, Awareness, Safety/Judgement                       PT - Cognition Comments: Pt A,Ox4. He is hyperactive, but cooperative. Pt appeared very anxious, jittery, and demonstrated tremor of the BUE. He varied between happy/excited and sad/tearful. Pt is quick to move and lacks fully safety awareness. Following commands: Intact       Cueing Cueing Techniques: Verbal cues, Gestural cues     General Comments General comments (skin integrity, edema, etc.): VSS on RA. Pt on water seal during mobility.    Exercises     Assessment/Plan    PT Assessment Patient needs continued PT services  PT Problem List Decreased balance;Decreased mobility;Decreased safety awareness;Decreased knowledge of use of DME       PT Treatment Interventions DME instruction;Gait training;Stair training;Functional mobility training;Therapeutic activities;Therapeutic exercise;Balance training;Patient/family education    PT Goals (Current goals can be found in the Care  Plan section)  Acute Rehab PT Goals Patient Stated Goal: Return Home PT Goal Formulation: With patient Time For Goal Achievement: 01/18/24 Potential to Achieve Goals: Good    Frequency Min 1X/week     Co-evaluation               AM-PAC PT "6 Clicks" Mobility  Outcome Measure Help needed turning from your back to your side while in a flat bed without using  bedrails?: A Little Help needed moving from lying on your back to sitting on the side of a flat bed without using bedrails?: A Little Help needed moving to and from a bed to a chair (including a wheelchair)?: A Little Help needed standing up from a chair using your arms (e.g., wheelchair or bedside chair)?: A Little Help needed to walk in hospital room?: A Little Help needed climbing 3-5 steps with a railing? : A Little 6 Click Score: 18    End of Session Equipment Utilized During Treatment: Gait belt Activity Tolerance: Patient tolerated treatment well Patient left: in bed;with call bell/phone within reach;with bed alarm set Nurse Communication: Mobility status PT Visit Diagnosis: Difficulty in walking, not elsewhere classified (R26.2);Unsteadiness on feet (R26.81);Other abnormalities of gait and mobility (R26.89)    Time: 1610-9604 PT Time Calculation (min) (ACUTE ONLY): 28 min   Charges:   PT Evaluation $PT Eval Moderate Complexity: 1 Mod   PT General Charges $$ ACUTE PT VISIT: 1 Visit         Glenford Lanes, PT, DPT Acute Rehabilitation Services Office: [phone] Secure Chat Preferred  Riva Chester 01/04/2024, 12:37 PM

## 2024-01-04 NOTE — TOC Initial Note (Addendum)
 Transition of Care [facility]) - Initial/Assessment Note    Patient Details  Name: Robert Erickson MRN: [id number] Date of Birth: 02-16-1971  Transition of Care [facility]) CM/SW Contact:    Jennett Model, RN Phone Number: 01/04/2024, 4:51 PM  Clinical Narrative:                 From home with SO, has PCP  [facility] [address] and insurance on file, states has no HH services in place at this time or DME at home.  States family member will transport them home at [employer] and family is support system, states gets medications from [facility] in [place].  Pta self ambulatory.   NCM offered choice for HHPT, he does not have a prference, NCM made referral to [facility] [place].  Soc will begin 24 to 48 hrs post dc.  Per Critical Doctor Diania Fortes just leave dry dressing over trach site it will heal up on its own.   Expected Discharge Plan: Home w Home Health Services Barriers to Discharge: Continued Medical Work up   Patient Goals and CMS Choice Patient states their goals for this hospitalization and ongoing recovery are:: return home CMS Medicare.gov Compare Post Acute Care list provided to:: Patient Choice offered to / list presented to : Patient      Expected Discharge Plan and Services In-house Referral: NA Discharge Planning Services: CM Consult Post Acute Care Choice: Home Health Living arrangements for the past 2 months: Single Family Home                 DME Arranged: N/A         HH Arranged: PT HH Agency: [facility] Date [facility] Agency Contacted: 01/04/24 Time HH Agency Contacted: 1650 Representative spoke with at [facility] Agency: Randel Buss  Prior Living Arrangements/Services Living arrangements for the past 2 months: Single Family Home Lives with:: Significant Other Patient language and need for interpreter reviewed:: Yes Do you feel safe going back to the place where you live?: Yes      Need for Family Participation in Patient Care: Yes (Comment) Care giver support system in  place?: Yes (comment)   Criminal Activity/Legal Involvement Pertinent to Current Situation/Hospitalization: No - Comment as needed  Activities of Daily Living   ADL Screening (condition at time of admission) Independently performs ADLs?: No Does the patient have a NEW difficulty with bathing/dressing/toileting/self-feeding that is expected to last >3 days?: Yes (Initiates electronic notice to provider for possible OT consult) Does the patient have a NEW difficulty with getting in/out of bed, walking, or climbing stairs that is expected to last >3 days?: Yes (Initiates electronic notice to provider for possible PT consult) Does the patient have a NEW difficulty with communication that is expected to last >3 days?: No Is the patient deaf or have difficulty hearing?: No Does the patient have difficulty seeing, even when wearing glasses/contacts?: No Does the patient have difficulty concentrating, remembering, or making decisions?: Yes  Permission Sought/Granted Permission sought to share information with : Case Manager, Family Supports Permission granted to share information with : Yes, Verbal Permission Granted  Share Information with NAME: Nanda Baar  Permission granted to share info w AGENCY: HH  Permission granted to share info w Relationship: SO  Permission granted to share info w Contact Information: [phone]  Emotional Assessment Appearance:: Appears stated age Attitude/Demeanor/Rapport: Engaged Affect (typically observed): Appropriate Orientation: : Oriented to Self, Oriented to Place, Oriented to  Time, Oriented to Situation Alcohol /  Substance Use: Not Applicable Psych Involvement: No (comment)  Admission diagnosis:  Tension pneumothorax [J93.0] Pneumothorax, left [J93.9] Patient Active Problem List   Diagnosis Date Noted   Pneumothorax, left 01/01/2024   Bronchopleural fistula (HCC) 01/01/2024   Essential hypertension 01/01/2024   ADHD 01/01/2024   Generalized  anxiety disorder 01/01/2024   IBS (irritable bowel syndrome) 01/01/2024   Depression 01/01/2024   GERD (gastroesophageal reflux disease) 01/01/2024   Asthma, chronic 01/01/2024   Obstructive sleep apnea 01/01/2024   Leukocytosis 01/01/2024   Tension pneumothorax 01/01/2024   PCP:  Patient, No Pcp Per Pharmacy:   [facility] [place] [place] - [address] [place] [address] Phone: [phone] Fax: [phone]     Social Drivers of Health (SDOH) Social History: SDOH Screenings   Food Insecurity: No Food Insecurity (01/02/2024)  Housing: Low Risk  (01/02/2024)  Transportation Needs: No Transportation Needs (01/02/2024)  Utilities: Not At Risk (01/02/2024)  Tobacco Use: High Risk (01/02/2024)   SDOH Interventions:     Readmission Risk Interventions     No data to display

## 2024-01-04 NOTE — Progress Notes (Signed)
 Progress Note   Patient: Robert Erickson FMW:[id number] DOB: [birth year] DOA: 01/01/2024     3 DOS: the patient was seen and examined on 01/04/2024   Brief hospital course: 53 y.o. 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   Patient has been transferred from [facility].  The chest tube has been removed today morning at the LTAC and patient found to have recurrent pneumothorax so he has been sent to the ED for cardiothoracic/pulmonary evaluation.   Patient reported that he developed pneumonia 1 month ago and being admitted at [facility] however could not recall any details about this hospitalization except he remembers he woke up at [facility] with a tracheostomy and chest tube.  Eventually trach has been decannulated and it has been healing well.  At [facility] patient redeveloped pneumothorax second time (unknown timeline) and chest tube was placed has eventually removed today 01/01/2023 and excepted plan to discharge to home tomorrow.  Since the removal of the chest tube patient started feeling short of breath, left-sided chest wall pain and very anxious.  Had a x-ray which showed development of pneumothorax-this is the third time.   Assessment and Plan: Tension pneumothorax History of recurrent pneumothorax History of tracheostomy s/p decannulation Pigtail chest tube in place on the left Admits to improvement in respiratory function To repeat chest x-ray in the morning Cardiothoracic surgery on board as well as pulmonologist Pulmonologist as placed chest tube to water seal with plans to get another x-ray tomorrow morning if looks good will do clamping trial    Reactive  leukocytosis -WBC count 13 on admit, now resolved.    Essential hypertension -Currently not any blood pressure regimen, diastolic elevated in ER.  No antihypertensives Continue to monitor blood pressure   Generalized anxiety disorder/Depression/Insomnia -Psych consulted by admitting physician due to anxiety prior to placement of chest tube - signed off.   Patient is more calm with no anxiety at this time   ADHD Continue Adderall 10 mg twice daily.   TOC: - patient adamantly against returning to [facility].   - Will wait to see long-term plan and then decide on next disposition steps.  Working with TOC   PPX: Continue SCD   Subjective:   Patient seen and examined at bedside this morning Denies nausea vomiting abdominal pain chest pain Chest tube still in place  Physical Exam: Gen:  Alert, cooperative patient who appears stated age Neck:  Trachea midline, no crepitus Cardiac:  Regular rate and rhythm.   Lungs: Air entry decreased especially at the bases, chest tube in place Abd:  Soft/nondistended/nontender.   Ext:  No clubbing/cyanosis/erythema.  No edema noted bilateral lower extremities.   Neuro:  Oriented x 4 Psych: Anxious appearing, Normal speech pattern, good affect.      Family Communication: none at bedside   Disposition: Status is: Inpatient Remains inpatient appropriate   Planned Discharge Destination:  unclear at this time       Vitals:   01/04/24 0525 01/04/24 0724 01/04/24 0812 01/04/24 1111  BP: 100/70 113/88  103/69  Pulse: 97 92 90 89  Resp: 17  20 18   Temp: (!) 97.4 F (36.3 C) (!) 97.5 F (36.4 C)  (!) 97.5 F (36.4 C)  TempSrc: Oral Oral  Oral  SpO2: 94% 98%  97% 95%  Weight: 79.6 kg     Height:        Data Reviewed: Chest x-ray this morning reviewed    Latest Ref Rng & Units 01/04/2024    2:47 AM 01/03/2024    3:14 AM 01/02/2024    5:55 AM  CBC  WBC 4.0 - 10.5 K/uL 9.5  9.3  8.7   Hemoglobin 13.0 - 17.0 g/dL 87.0  87.6  86.5    Hematocrit 39.0 - 52.0 % 39.7  39.3  41.8   Platelets 150 - 400 K/uL 309  262  278        Latest Ref Rng & Units 01/04/2024    2:47 AM 01/03/2024    3:14 AM 01/02/2024    5:55 AM  BMP  Glucose 70 - 99 mg/dL 878  95  89   BUN 6 - 20 mg/dL 13  8  11    Creatinine 0.61 - 1.24 mg/dL 8.86  8.89  9.14   Sodium 135 - 145 mmol/L 139  140  137   Potassium 3.5 - 5.1 mmol/L 3.8  4.1  3.2   Chloride 98 - 111 mmol/L 104  105  107   CO2 22 - 32 mmol/L 25  24  24    Calcium  8.9 - 10.3 mg/dL 8.8  8.8  8.9       Disposition: Pending pigtail removal and clearance by pulmonologist before discharge  Time spent: 40 minutes  Author: Drue ONEIDA Potter, MD 01/04/2024 1:52 PM  For on call review [email or web address].

## 2024-01-04 NOTE — Progress Notes (Signed)
 NAME:  Robert Erickson, MRN:  [id number], DOB:  18-May-1971, LOS: 3 ADMISSION DATE:  01/01/2024, CONSULTATION DATE:  01/01/2024 REFERRING MD:  Dorothey Gate, PA-C, CHIEF COMPLAINT:  Pneumonthorax   History of Present Illness:  53 y/o male with PMH for IBS, GERD, HCL, Depression, Asthma, renal stone and severe anxiety who apparently had pneumonia about 1 month ago and then eventually was d/c to [facility] and had a trach and at some point had a PTX requiring chest tube.  The chest tube was removed today in preparation for him to be d/c home.  However, post chest tube, he re-developed a left sided PTX prompting him to be sent to [facility]. Cxr at [facility] confirming a moderate sized left PTX.  Patient's vital otherwise stable as is patient despite his significant anxiety.    Pertinent  Medical History  S/p Trach S/p Left chest tube Anxiety/Depression IBD GERD Pneumonia  Significant Hospital Events: Including procedures, antibiotic start and stop dates in addition to other pertinent events   4/15 Admit for PTX 4/16 Chest tube placed by IR 4/17 chest tube on suction, CXR with no pneumothorax  Interim History / Subjective:   Follow CXR shows no pneumothorax this morning Patient up walking with PT No air leak noted  Objective   Blood pressure 103/69, pulse 89, temperature (!) 97.5 F (36.4 C), temperature source Oral, resp. rate 18, height 5\' 10"  (1.778 m), weight 79.6 kg, SpO2 95%.        Intake/Output Summary (Last 24 hours) at 01/04/2024 1212 Last data filed at 01/04/2024 0900 Gross per 24 hour  Intake 474 ml  Output 1081 ml  Net -607 ml   Filed Weights   01/01/24 1924 01/03/24 0340 01/04/24 0525  Weight: 120.7 kg 81.3 kg 79.6 kg   Examination: General: no acute distress, walking in hall way with PT HENT: PERRLA no icterus, EOMI, trach stoma almost closed Lungs: diminished breath sounds, no wheezing. Chest tube in place, no air leak on water seal Cardiovascular: reg s1s2 no murmurs or  gallops Abdomen: soft nt nd bs pos no guarding Extremities: no cyanosis, clubbing or edema Neuro: AAOx3 CN II-Xii Grossly intact  Resolved Hospital Problem list   N/a  Assessment & Plan:  Left Pneumothorax - chest tube to water seal - repeat CXR tomorrow morning, if no pneumothorax proceed with clamp trial and repeat CXR tomorrow PM - if stable remove chest tube tomorrow with potential for DC - no surgical intervention planned at this time  COPD/Emphysema - continue dulera  twice daily - prn nebs  PCCM will continue to follow  Best Practice (right click and "Reselect all SmartList Selections" daily)   Per primary team   Labs   CBC: Recent Labs  Lab 01/01/24 1940 01/01/24 1942 01/02/24 0555 01/03/24 0314 01/04/24 0247  WBC 13.1*  --  8.7 9.3 9.5  NEUTROABS 9.0*  --   --   --   --   HGB 14.6 15.0 13.4 12.3* 12.9*  HCT 45.2 44.0 41.8 39.3 39.7  MCV 92.1  --  93.9 93.1 92.8  PLT 351  --  278 262 309    Basic Metabolic Panel: Recent Labs  Lab 01/01/24 1940 01/01/24 1942 01/02/24 0555 01/03/24 0314 01/04/24 0247  NA 138 138 137 140 139  K 3.9 3.9 3.2* 4.1 3.8  CL 106  --  107 105 104  CO2 21*  --  24 24 25   GLUCOSE 95  --  89 95 121*  BUN 13  --  11 8 13   CREATININE 0.89  --  0.85 1.10 1.13  CALCIUM 9.7  --  8.9 8.8* 8.8*   GFR: Estimated Creatinine Clearance: 79 mL/min (by C-G formula based on SCr of 1.13 mg/dL). Recent Labs  Lab 01/01/24 1940 01/02/24 0555 01/03/24 0314 01/04/24 0247  WBC 13.1* 8.7 9.3 9.5    Liver Function Tests: Recent Labs  Lab 01/01/24 1940 01/02/24 0555  AST 19 16  ALT 38 29  ALKPHOS 113 86  BILITOT 0.7 0.7  PROT 7.6 6.3*  ALBUMIN 3.3* 2.8*   No results for input(s): "LIPASE", "AMYLASE" in the last 168 hours. No results for input(s): "AMMONIA" in the last 168 hours.  ABG    Component Value Date/Time   HCO3 20.8 01/01/2024 1942   TCO2 22 01/01/2024 1942   O2SAT 63 01/01/2024 1942     Coagulation  Profile: Recent Labs  Lab 01/01/24 1940  INR 1.0    Cardiac Enzymes: No results for input(s): "CKTOTAL", "CKMB", "CKMBINDEX", "TROPONINI" in the last 168 hours.  HbA1C: No results found for: "HGBA1C"  CBG: No results for input(s): "GLUCAP" in the last 168 hours.         Duaine German, MD [facility] Office: [phone]   See Amion for personal pager PCCM on call pager [phone] until 7pm. Please call Elink 7p-7a. [phone]

## 2024-01-05 ENCOUNTER — Inpatient Hospital Stay (HOSPITAL_COMMUNITY)

## 2024-01-05 ENCOUNTER — Telehealth: Payer: Self-pay | Admitting: Pulmonary Disease

## 2024-01-05 ENCOUNTER — Other Ambulatory Visit (HOSPITAL_COMMUNITY): Payer: Self-pay

## 2024-01-05 DIAGNOSIS — K589 Irritable bowel syndrome without diarrhea: Secondary | ICD-10-CM

## 2024-01-05 DIAGNOSIS — J45909 Unspecified asthma, uncomplicated: Secondary | ICD-10-CM

## 2024-01-05 DIAGNOSIS — J930 Spontaneous tension pneumothorax: Secondary | ICD-10-CM | POA: Diagnosis not present

## 2024-01-05 DIAGNOSIS — G4733 Obstructive sleep apnea (adult) (pediatric): Secondary | ICD-10-CM | POA: Diagnosis not present

## 2024-01-05 DIAGNOSIS — J449 Chronic obstructive pulmonary disease, unspecified: Secondary | ICD-10-CM | POA: Diagnosis not present

## 2024-01-05 DIAGNOSIS — J939 Pneumothorax, unspecified: Secondary | ICD-10-CM | POA: Diagnosis not present

## 2024-01-05 DIAGNOSIS — F909 Attention-deficit hyperactivity disorder, unspecified type: Secondary | ICD-10-CM | POA: Diagnosis not present

## 2024-01-05 LAB — CBC
HCT: 41.7 % (ref 39.0–52.0)
Hemoglobin: 13.5 g/dL (ref 13.0–17.0)
MCH: 30.2 pg (ref 26.0–34.0)
MCHC: 32.4 g/dL (ref 30.0–36.0)
MCV: 93.3 fL (ref 80.0–100.0)
Platelets: 302 10*3/uL (ref 150–400)
RBC: 4.47 MIL/uL (ref 4.22–5.81)
RDW: 12.7 % (ref 11.5–15.5)
WBC: 10.6 10*3/uL — ABNORMAL HIGH (ref 4.0–10.5)
nRBC: 0 % (ref 0.0–0.2)

## 2024-01-05 LAB — BASIC METABOLIC PANEL WITH GFR
Anion gap: 13 (ref 5–15)
BUN: 10 mg/dL (ref 6–20)
CO2: 21 mmol/L — ABNORMAL LOW (ref 22–32)
Calcium: 9.1 mg/dL (ref 8.9–10.3)
Chloride: 103 mmol/L (ref 98–111)
Creatinine, Ser: 0.87 mg/dL (ref 0.61–1.24)
GFR, Estimated: >60 mL/min (ref >60)
Glucose, Bld: 101 mg/dL — ABNORMAL HIGH (ref 70–99)
Potassium: 3.7 mmol/L (ref 3.5–5.1)
Sodium: 137 mmol/L (ref 135–145)

## 2024-01-05 LAB — ALPHA-1-ANTITRYPSIN: A-1 Antitrypsin, Ser: 181 mg/dL (ref 101–187)

## 2024-01-05 MED ORDER — SENNOSIDES-DOCUSATE SODIUM 8.6-50 MG PO TABS: 1.0000 | ORAL_TABLET | Freq: Two times a day (BID) | ORAL | Status: DC | PRN

## 2024-01-05 MED ORDER — ENSURE ENLIVE PO LIQD: 237.0000 mL | Freq: Two times a day (BID) | ORAL | Status: DC

## 2024-01-05 MED ORDER — GUAIFENESIN ER 600 MG PO TB12: 600.0000 mg | ORAL_TABLET | Freq: Two times a day (BID) | ORAL | Status: AC

## 2024-01-05 MED ORDER — BUDESONIDE-FORMOTEROL FUMARATE 160-4.5 MCG/ACT IN AERO
2.0000 | INHALATION_SPRAY | Freq: Two times a day (BID) | RESPIRATORY_TRACT | 1 refills | Status: DC
  Filled 2024-01-05: qty 10.2, 30d supply, fill #0

## 2024-01-05 MED ORDER — ALBUTEROL SULFATE HFA 108 (90 BASE) MCG/ACT IN AERS
2.0000 | INHALATION_SPRAY | Freq: Four times a day (QID) | RESPIRATORY_TRACT | 2 refills | Status: DC | PRN
  Filled 2024-01-05: qty 18, 25d supply, fill #0

## 2024-01-05 MED ORDER — OXYCODONE-ACETAMINOPHEN 5-325 MG PO TABS
1.5000 | ORAL_TABLET | Freq: Four times a day (QID) | ORAL | Status: DC | PRN
  Administered 2024-01-05 (×2): 1.5 via ORAL
  Filled 2024-01-05 (×2): qty 2

## 2024-01-05 NOTE — Plan of Care (Signed)

## 2024-01-05 NOTE — Plan of Care (Signed)
  Problem: Health Behavior/Discharge Planning: Goal: Ability to manage health-related needs will improve 01/05/2024 1752 by Brenton Cambridge, RN Outcome: Adequate for Discharge 01/05/2024 1317 by Brenton Cambridge, RN Outcome: Adequate for Discharge 01/05/2024 1046 by Brenton Cambridge, RN Outcome: Progressing   Problem: Clinical Measurements: Goal: Ability to maintain clinical measurements within normal limits will improve 01/05/2024 1752 by Brenton Cambridge, RN Outcome: Adequate for Discharge 01/05/2024 1317 by Brenton Cambridge, RN Outcome: Adequate for Discharge 01/05/2024 1046 by Brenton Cambridge, RN Outcome: Progressing Goal: Will remain free from infection 01/05/2024 1752 by Brenton Cambridge, RN Outcome: Adequate for Discharge 01/05/2024 1317 by Brenton Cambridge, RN Outcome: Adequate for Discharge 01/05/2024 1046 by Brenton Cambridge, RN Outcome: Progressing Goal: Diagnostic test results will improve 01/05/2024 1752 by Brenton Cambridge, RN Outcome: Adequate for Discharge 01/05/2024 1317 by Brenton Cambridge, RN Outcome: Adequate for Discharge 01/05/2024 1046 by Brenton Cambridge, RN Outcome: Progressing Goal: Respiratory complications will improve 01/05/2024 1752 by Brenton Cambridge, RN Outcome: Adequate for Discharge 01/05/2024 1317 by Brenton Cambridge, RN Outcome: Adequate for Discharge 01/05/2024 1046 by Brenton Cambridge, RN Outcome: Progressing Goal: Cardiovascular complication will be avoided 01/05/2024 1752 by Brenton Cambridge, RN Outcome: Adequate for Discharge 01/05/2024 1317 by Brenton Cambridge, RN Outcome: Adequate for Discharge 01/05/2024 1046 by Brenton Cambridge, RN Outcome: Progressing   Problem: Activity: Goal: Risk for activity intolerance will decrease 01/05/2024 1752 by Brenton Cambridge, RN Outcome: Adequate for Discharge 01/05/2024 1317 by Brenton Cambridge, RN Outcome: Adequate for Discharge 01/05/2024 1046 by Brenton Cambridge, RN Outcome: Progressing   Problem:  Nutrition: Goal: Adequate nutrition will be maintained 01/05/2024 1752 by Brenton Cambridge, RN Outcome: Adequate for Discharge 01/05/2024 1317 by Brenton Cambridge, RN Outcome: Adequate for Discharge 01/05/2024 1046 by Brenton Cambridge, RN Outcome: Progressing   Problem: Coping: Goal: Level of anxiety will decrease 01/05/2024 1752 by Brenton Cambridge, RN Outcome: Adequate for Discharge 01/05/2024 1317 by Brenton Cambridge, RN Outcome: Adequate for Discharge 01/05/2024 1046 by Brenton Cambridge, RN Outcome: Progressing   Problem: Elimination: Goal: Will not experience complications related to bowel motility 01/05/2024 1752 by Brenton Cambridge, RN Outcome: Adequate for Discharge 01/05/2024 1317 by Brenton Cambridge, RN Outcome: Adequate for Discharge 01/05/2024 1046 by Brenton Cambridge, RN Outcome: Progressing Goal: Will not experience complications related to urinary retention 01/05/2024 1752 by Brenton Cambridge, RN Outcome: Adequate for Discharge 01/05/2024 1317 by Brenton Cambridge, RN Outcome: Adequate for Discharge 01/05/2024 1046 by Brenton Cambridge, RN Outcome: Progressing   Problem: Pain Managment: Goal: General experience of comfort will improve and/or be controlled 01/05/2024 1752 by Brenton Cambridge, RN Outcome: Adequate for Discharge 01/05/2024 1317 by Brenton Cambridge, RN Outcome: Adequate for Discharge 01/05/2024 1046 by Brenton Cambridge, RN Outcome: Progressing   Problem: Safety: Goal: Ability to remain free from injury will improve 01/05/2024 1752 by Brenton Cambridge, RN Outcome: Adequate for Discharge 01/05/2024 1317 by Brenton Cambridge, RN Outcome: Adequate for Discharge 01/05/2024 1046 by Brenton Cambridge, RN Outcome: Progressing   Problem: Skin Integrity: Goal: Risk for impaired skin integrity will decrease 01/05/2024 1752 by Brenton Cambridge, RN Outcome: Adequate for Discharge 01/05/2024 1317 by Brenton Cambridge, RN Outcome: Adequate for Discharge 01/05/2024 1046 by  Brenton Cambridge, RN Outcome: Progressing

## 2024-01-05 NOTE — Progress Notes (Signed)
 NAME:  Robert Erickson, MRN:  [id number], DOB:  04/07/1971, LOS: 4 ADMISSION DATE:  01/01/2024, CONSULTATION DATE:  01/01/2024 REFERRING MD:  Dorothey Gate, PA-C, CHIEF COMPLAINT:  Pneumonthorax   History of Present Illness:  53 y/o male with PMH for IBS, GERD, HCL, Depression, Asthma, renal stone and severe anxiety who apparently had pneumonia about 1 month ago and then eventually was d/c to [facility] and had a trach and at some point had a PTX requiring chest tube.  The chest tube was removed today in preparation for him to be d/c home.  However, post chest tube, he re-developed a left sided PTX prompting him to be sent to [facility]. Cxr at [facility] confirming a moderate sized left PTX.  Patient's vital otherwise stable as is patient despite his significant anxiety.    Pertinent  Medical History  S/p Trach S/p Left chest tube Anxiety/Depression IBD GERD Pneumonia  Significant Hospital Events: Including procedures, antibiotic start and stop dates in addition to other pertinent events   4/15 Admit for PTX 4/16 Chest tube placed by IR 4/17 chest tube on suction, CXR with no pneumothorax  Interim History / Subjective:   Follow CXR shows no pneumothorax this morning Chest tube clamped for trial  Objective   Blood pressure (!) 151/91, pulse 88, temperature 97.6 F (36.4 C), temperature source Oral, resp. rate 20, height 5\' 10"  (1.778 m), weight 78.2 kg, SpO2 96%.        Intake/Output Summary (Last 24 hours) at 01/05/2024 1134 Last data filed at 01/05/2024 0431 Gross per 24 hour  Intake 240 ml  Output 1225 ml  Net -985 ml   Filed Weights   01/03/24 0340 01/04/24 0525 01/05/24 0424  Weight: 81.3 kg 79.6 kg 78.2 kg   Examination: General: no acute distress, walking in hall way with PT HENT: PERRLA no icterus, EOMI, trach stoma almost closed Lungs: diminished breath sounds, no wheezing. Chest tube in place, no air leak on water seal Cardiovascular: reg s1s2 no murmurs or gallops Abdomen:  soft nt nd bs pos no guarding Extremities: no cyanosis, clubbing or edema Neuro: AAOx3 CN II-Xii Grossly intact  Resolved Hospital Problem list   N/a  Assessment & Plan:  Left Pneumothorax - clamp trial - repeat CXR at 4pm, if no pneumothorax remove chest tube. - no surgical intervention planned at this time, if recurrs in the future will need surgical intervention due to underlying bullous emphysema.  COPD/Emphysema - continue dulera  twice daily - prn nebs  Will arrange outpatient follow up.  Best Practice (right click and "Reselect all SmartList Selections" daily)   Per primary team   Labs   CBC: Recent Labs  Lab 01/01/24 1940 01/01/24 1942 01/02/24 0555 01/03/24 0314 01/04/24 0247 01/05/24 0326  WBC 13.1*  --  8.7 9.3 9.5 10.6*  NEUTROABS 9.0*  --   --   --   --   --   HGB 14.6 15.0 13.4 12.3* 12.9* 13.5  HCT 45.2 44.0 41.8 39.3 39.7 41.7  MCV 92.1  --  93.9 93.1 92.8 93.3  PLT 351  --  278 262 309 302    Basic Metabolic Panel: Recent Labs  Lab 01/01/24 1940 01/01/24 1942 01/02/24 0555 01/03/24 0314 01/04/24 0247 01/05/24 0326  NA 138 138 137 140 139 137  K 3.9 3.9 3.2* 4.1 3.8 3.7  CL 106  --  107 105 104 103  CO2 21*  --  24 24 25  21*  GLUCOSE 95  --  89  95 121* 101*  BUN 13  --  11 8 13 10   CREATININE 0.89  --  0.85 1.10 1.13 0.87  CALCIUM 9.7  --  8.9 8.8* 8.8* 9.1   GFR: Estimated Creatinine Clearance: 102.6 mL/min (by C-G formula based on SCr of 0.87 mg/dL). Recent Labs  Lab 01/02/24 0555 01/03/24 0314 01/04/24 0247 01/05/24 0326  WBC 8.7 9.3 9.5 10.6*    Liver Function Tests: Recent Labs  Lab 01/01/24 1940 01/02/24 0555  AST 19 16  ALT 38 29  ALKPHOS 113 86  BILITOT 0.7 0.7  PROT 7.6 6.3*  ALBUMIN 3.3* 2.8*   No results for input(s): "LIPASE", "AMYLASE" in the last 168 hours. No results for input(s): "AMMONIA" in the last 168 hours.  ABG    Component Value Date/Time   HCO3 20.8 01/01/2024 1942   TCO2 22 01/01/2024  1942   O2SAT 63 01/01/2024 1942     Coagulation Profile: Recent Labs  Lab 01/01/24 1940  INR 1.0    Cardiac Enzymes: No results for input(s): "CKTOTAL", "CKMB", "CKMBINDEX", "TROPONINI" in the last 168 hours.  HbA1C: No results found for: "HGBA1C"  CBG: No results for input(s): "GLUCAP" in the last 168 hours.         Duaine German, MD [facility] Office: [phone]   See Amion for personal pager PCCM on call pager [phone] until 7pm. Please call Elink 7p-7a. [phone]

## 2024-01-05 NOTE — Plan of Care (Signed)
  Problem: Health Behavior/Discharge Planning: Goal: Ability to manage health-related needs will improve 01/05/2024 1317 by Brenton Cambridge, RN Outcome: Adequate for Discharge 01/05/2024 1046 by Brenton Cambridge, RN Outcome: Progressing   Problem: Clinical Measurements: Goal: Ability to maintain clinical measurements within normal limits will improve 01/05/2024 1317 by Brenton Cambridge, RN Outcome: Adequate for Discharge 01/05/2024 1046 by Brenton Cambridge, RN Outcome: Progressing Goal: Will remain free from infection 01/05/2024 1317 by Brenton Cambridge, RN Outcome: Adequate for Discharge 01/05/2024 1046 by Brenton Cambridge, RN Outcome: Progressing Goal: Diagnostic test results will improve 01/05/2024 1317 by Brenton Cambridge, RN Outcome: Adequate for Discharge 01/05/2024 1046 by Brenton Cambridge, RN Outcome: Progressing Goal: Respiratory complications will improve 01/05/2024 1317 by Brenton Cambridge, RN Outcome: Adequate for Discharge 01/05/2024 1046 by Brenton Cambridge, RN Outcome: Progressing Goal: Cardiovascular complication will be avoided 01/05/2024 1317 by Brenton Cambridge, RN Outcome: Adequate for Discharge 01/05/2024 1046 by Brenton Cambridge, RN Outcome: Progressing   Problem: Activity: Goal: Risk for activity intolerance will decrease 01/05/2024 1317 by Brenton Cambridge, RN Outcome: Adequate for Discharge 01/05/2024 1046 by Brenton Cambridge, RN Outcome: Progressing   Problem: Nutrition: Goal: Adequate nutrition will be maintained 01/05/2024 1317 by Brenton Cambridge, RN Outcome: Adequate for Discharge 01/05/2024 1046 by Brenton Cambridge, RN Outcome: Progressing   Problem: Coping: Goal: Level of anxiety will decrease 01/05/2024 1317 by Brenton Cambridge, RN Outcome: Adequate for Discharge 01/05/2024 1046 by Brenton Cambridge, RN Outcome: Progressing   Problem: Elimination: Goal: Will not experience complications related to bowel motility 01/05/2024 1317 by Brenton Cambridge, RN Outcome: Adequate for Discharge 01/05/2024 1046 by Brenton Cambridge, RN Outcome: Progressing Goal: Will not experience complications related to urinary retention 01/05/2024 1317 by Brenton Cambridge, RN Outcome: Adequate for Discharge 01/05/2024 1046 by Brenton Cambridge, RN Outcome: Progressing   Problem: Pain Managment: Goal: General experience of comfort will improve and/or be controlled 01/05/2024 1317 by Brenton Cambridge, RN Outcome: Adequate for Discharge 01/05/2024 1046 by Brenton Cambridge, RN Outcome: Progressing   Problem: Safety: Goal: Ability to remain free from injury will improve 01/05/2024 1317 by Brenton Cambridge, RN Outcome: Adequate for Discharge 01/05/2024 1046 by Brenton Cambridge, RN Outcome: Progressing   Problem: Skin Integrity: Goal: Risk for impaired skin integrity will decrease 01/05/2024 1317 by Brenton Cambridge, RN Outcome: Adequate for Discharge 01/05/2024 1046 by Brenton Cambridge, RN Outcome: Progressing

## 2024-01-05 NOTE — Progress Notes (Signed)
 Physical Therapy Treatment Patient Details Name: Robert Erickson MRN: [id number] DOB: 27-Feb-1971 Today's Date: 01/05/2024   History of Present Illness Robert Erickson is a 53 y.o. male admitted 01/01/24 from [facility] for a recurrent left pneumothorax. Pt presented with severe anxiety, left sided chest wall pain, and shortness of breath. Pt reportedly developed pneumonia about 1 month ago and was admitted at [facility] however could not recall any details about this hospitalization except he remembers he woke up at [facility] with a tracheostomy and chest tube. Chest x-ray showing recurrent and increased left-sided pneumothorax as compared to prior exam. 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    PT Comments  Pt eager to mobilize, and is hoping to have chest tube removed today. Pt ambulatory around the unit with RW and supervision for safety only, pt demonstrating good activity tolerance. Pt does get mildly tachycardic with gait, up to 134 bpm, but pt says this is normal for him as of late given prolonged hospitalizations. Pt instructed in calf stretch as pt complaining of calf tightness during gait. Pt with no further acute PT needs at this time, continue to encourage OOB mobility with mobility specialists and RN staff. PT to sign off.     If plan is discharge home, recommend the following: A little help with walking and/or transfers;A little help with bathing/dressing/bathroom;Help with stairs or ramp for entrance   Can travel by private vehicle        Equipment Recommendations  Rolling walker (2 wheels)    Recommendations for Other Services       Precautions / Restrictions Precautions Precautions: Fall Recall of Precautions/Restrictions: Intact Precaution/Restrictions  Comments: L Chest Tube to water seal Restrictions Weight Bearing Restrictions Per Provider Order: No     Mobility  Bed Mobility Overal bed mobility: Needs Assistance Bed Mobility: Supine to Sit, Sit to Supine     Supine to sit: Supervision Sit to supine: Supervision   General bed mobility comments: PT watching lines/leads especially chest tube    Transfers Overall transfer level: Needs assistance Equipment used: Rolling walker (2 wheels) Transfers: Sit to/from Stand Sit to Stand: Supervision           General transfer comment: for safety, good hand placement when rising/sitting.    Ambulation/Gait Ambulation/Gait assistance: Supervision Gait Distance (Feet): 700 Feet Assistive device: Rolling walker (2 wheels) Gait Pattern/deviations: Step-through pattern, Drifts right/left Gait velocity: WFL     General Gait Details: for safety, min cues for hallway navigation but pt with good gait speed and use of RW   Stairs             Wheelchair Mobility     Tilt Bed    Modified Rankin (Stroke Patients Only)       Balance Overall balance assessment: Mild deficits observed, not formally tested                                          Communication Communication Factors Affecting Communication:  (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 Encouraged x3 sets at a time, PT also demonstrated standing runner's stretch  with wall support for stretching calves.     General Comments General comments (skin integrity, edema, etc.): SpO2 97% on RA, HR 134 bpm during gait. chest tube to water seal upon PT arrival and throughout session, left on water seal upon PT exit      Pertinent Vitals/Pain Pain Assessment Pain Assessment: Faces Faces Pain Scale: Hurts little more Pain Location: insertion of L chest tube Pain Descriptors / Indicators: Discomfort, Sore Pain Intervention(s): Limited activity within patient's tolerance, Monitored during session, Repositioned    Home Living                          Prior Function            PT Goals (current goals can now be found in the care plan section) Acute Rehab PT Goals Patient Stated Goal: Return Home PT Goal Formulation: With patient Time For Goal Achievement: 01/18/24 Potential to Achieve Goals: Good Progress towards PT goals: Progressing toward goals    Frequency    Min 1X/week      PT Plan      Co-evaluation              AM-PAC PT "6 Clicks" Mobility   Outcome Measure  Help needed turning from your back to your side while in a flat bed without using bedrails?: None Help needed moving from lying on your back to sitting on the side of a flat bed without using bedrails?: None Help needed moving to and from a bed to a chair (including a wheelchair)?: A Little Help needed standing up from a chair using your arms (e.g., wheelchair or bedside chair)?: A Little Help needed to walk in hospital room?: A Little Help needed climbing 3-5 steps with a railing? : A Little 6 Click Score: 20    End of Session   Activity Tolerance: Patient tolerated treatment well Patient left: in bed;with call bell/phone within reach;with bed alarm set Nurse Communication: Mobility status PT Visit Diagnosis: Other abnormalities of gait and mobility (R26.89)     Time: 1610-9604 PT Time Calculation (min) (ACUTE ONLY): 17 min  Charges:    $Gait  Training: 8-22 mins PT General Charges $$ ACUTE PT VISIT: 1 Visit                     Shirlene Doughty, PT DPT Acute Rehabilitation Services Secure Chat Preferred  Office [phone]    Halina Asano E Burnadette Carrion 01/05/2024, 9:50 AM

## 2024-01-05 NOTE — Progress Notes (Signed)
 OT Cancellation Note  Patient Details Name: Robert Erickson MRN: [id number] DOB: [birth year]   Cancelled Treatment:    Reason Eval/Treat Not Completed: OT screened, no needs identified, will sign off. Per chart review and conversation with pt, pt appears to be at baseline with ADLs. Noted he walked 700' at supervision level this morning with PT. Pt eager for d/c home. Will sign-off.  [name], OT Acute Rehabilitation Services Office: [phone]   [name] 01/05/2024, 11:21 AM

## 2024-01-05 NOTE — Discharge Summary (Signed)
 Physician Discharge Summary  Xzavian Semmel ZOX:[id number] DOB: 1971/08/07 DOA: 01/01/2024  PCP: Patient, No Pcp Per  Admit date: 01/01/2024 Discharge date: 01/05/24  Admitted From: [facility] Disposition: Home Recommendations for Outpatient Follow-up:  Follow up with PCP in 1 week Pulmonology to arrange outpatient follow-up in 1 month Check CMP and CBC at follow-up Please follow up on the following pending results: None  Home Health: [facility] PT/OT Equipment/Devices: Rolling walker  Discharge Condition: Stable CODE STATUS: Full code  Follow-up Information     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 Go on 02/21/2024.   Specialty: Family Medicine Why: @10 :00am please arrive 15 minutes early and bring photo ID and a list of medications. Contact information: [address] [place]  [id number] [phone] Additional information: [address]  [place], [place] [address]        Care, [facility] Follow up.   Specialty: Home Health Services Why: Agency will call you to set up apt times Contact information: [address] [place] [address] [phone]         Angelique Barer, MD. Schedule an appointment as soon as possible for a visit in 1 week(s).   Specialty: Internal Medicine Contact information: [address] [place] [address] [phone]                 Hospital course 53 year old M with PMH of asthma, OSA on CPAP, HTN, anxiety, depression, IBS, GERD and recurrent left pneumothorax presenting with shortness of breath, chest pain and anxiety after chest tube removal at [facility].  Patient reported that he developed pneumonia 1 month ago and being admitted at [facility] hospitl however could not recall any details about this hospitalization except he remembers he woke up at [facility] with a tracheostomy and chest tube.  Eventually trach has been decannulated and it has  been healing well.  At [facility] patient redeveloped pneumothorax second time (unknown timeline) and chest tube was placed again.  Eventually, a chest tube removed on 01/01/2023 in anticipation for discharge home.  However, patient developed acute short of breath, left-sided chest wall pain and became very anxious.  Chest x-ray showed development of pneumothorax, and patient was sent to [facility].    In ED, CXR with recurrent and increased left-sided pneumothorax compared to prior exam. CT chest with recurrent 15 to 20% left pneumothorax slightly smaller than the pneumothorax seen on prior CT on 11/30/2023, small amount of pneumomediastinum, moderate to advanced centrilobular and paraseptal emphysema with upper lobe bulla bilaterally.  IR consulted and he had fluoroscopy guided left chest tube placement.  Pulmonology consulted.   Eventually, pneumothorax resolved.  Chest tube removed on 4/19 and he was cleared for discharge by pulmonology.  Pulmonology to arrange outpatient follow-up.   See individual problem list below for more.   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   Essential hypertension: Normotensive.   - Continue Cardura   Anxiety, depression, insomnia, ADHD -Continue home meds  OSA on CPAP -Continue on CPAP  GERD -Continue PPI  BPH - Continue Cardura   Physical deconditioning -HHPT/OT/rolling walker ordered    Pressure skin injury: Present on admission. Pressure Injury 01/02/24 Buttocks Mid Stage 2 -  Partial thickness loss of dermis presenting as a shallow open injury with a red,  pink wound bed without slough. (Active)  01/02/24 1800  Location: Buttocks  Location Orientation: Mid  Staging: Stage 2 -   Partial thickness loss of dermis presenting as a shallow open injury with a red, pink wound bed without slough.  Wound Description (Comments):   Present on Admission: Yes  Dressing Type Foam - Lift dressing to assess site every shift 01/05/24 0746     Pressure Injury 01/04/24 Sacrum Mid Unstageable - Full thickness tissue loss in which the base of the injury is covered by slough (yellow, tan, gray, green or brown) and/or eschar (tan, brown or black) in the wound bed. Sarcum (Active)  01/04/24 1700  Location: Sacrum  Location Orientation: Mid  Staging: Unstageable - Full thickness tissue loss in which the base of the injury is covered by slough (yellow, tan, gray, green or brown) and/or eschar (tan, brown or black) in the wound bed.  Wound Description (Comments): Sarcum  Present on Admission: No  Dressing Type Foam - Lift dressing to assess site every shift 01/05/24 0746    Time spent 35 minutes  Vital signs Vitals:   01/05/24 0424 01/05/24 0746 01/05/24 0831 01/05/24 1118  BP: 94/68 101/72  (!) 151/91  Pulse: 78 74 87 88  Temp: 97.6 F (36.4 C) 98.2 F (36.8 C)  97.6 F (36.4 C)  Resp:  20 20 20   Height:      Weight: 78.2 kg     SpO2: 94% 99% 99% 96%  TempSrc: Axillary Oral  Oral  BMI (Calculated): 24.74        Discharge exam  GENERAL: No apparent distress.  Nontoxic. HEENT: MMM.  Vision and hearing grossly intact.  NECK: Decannulated tracheostomy RESP:  No IWOB.  Fair aeration bilaterally.  Chest tube to left chest CVS:  RRR. Heart sounds normal.  ABD/GI/GU: BS+. Abd soft, NTND.  MSK/EXT:  Moves extremities. No apparent deformity. No edema.  SKIN: Pressure skin injury on his buttoms  No signs of infection. NEURO: Awake and alert. Oriented appropriately.  No apparent focal neuro deficit. PSYCH: Calm. Normal affect.   Discharge Instructions Discharge Instructions     Diet general   Complete by: As directed    Discharge instructions   Complete by: As directed     It has been a pleasure taking care of you!  You were hospitalized due to left chest pneumothorax for which you have been treated with chest tube placement.  Pneumothorax resolved.  Use your medications as prescribed.  Please review your new medication list and the directions on your medications before you take them.  Follow-up with your primary care doctor in 1 to 2 weeks or sooner if needed.  Follow-up with pulmonary or GI per their recommendation.   Take care,   Discharge wound care:   Complete by: As directed    Offloading and close monitoring   Increase activity slowly   Complete by: As directed       Allergies as of 01/05/2024       Reactions   Shellfish Allergy Anaphylaxis   Shellfish-derived Products Anaphylaxis   Aspirin Rash   Penicillin G Rash        Medication List     STOP taking these medications    albuterol  (2.5 MG/3ML) 0.083% nebulizer solution Commonly known as: PROVENTIL  Replaced by: Ventolin  HFA 108 (90 Base) MCG/ACT inhaler   docusate sodium  100 MG capsule Commonly known as: COLACE   enoxaparin 40 MG/0.4ML injection Commonly known as: LOVENOX   LORazepam   0.5 MG tablet Commonly known as: ATIVAN    mometasone -formoterol  100-5 MCG/ACT Aero Commonly known as: DULERA    ondansetron  4 MG/2ML Soln injection Commonly known as: ZOFRAN    PHOSPHATE ENEMA RE   predniSONE 20 MG tablet Commonly known as: DELTASONE   tamsulosin 0.4 MG Caps capsule Commonly known as: FLOMAX       TAKE these medications    acetaminophen  650 MG CR tablet Commonly known as: TYLENOL  Take 650 mg by mouth every 6 (six) hours as needed for pain or fever.   amphetamine -dextroamphetamine  10 MG tablet Commonly known as: ADDERALL 1 tablet daily.   atorvastatin 10 MG tablet Commonly known as: LIPITOR Take 10 mg by mouth at bedtime.   buPROPion  200 MG 12 hr tablet Commonly known as: WELLBUTRIN  SR Take 200 mg by mouth daily.   clonazePAM  0.5 MG tablet Commonly known  as: KLONOPIN  Take 0.5 mg by mouth as directed. Take 1 in the morning and 2 in the afternoon   doxazosin 2 MG tablet Commonly known as: CARDURA Take 2 mg by mouth at bedtime.   escitalopram  20 MG tablet Commonly known as: LEXAPRO  Take 20 mg by mouth at bedtime.   feeding supplement Liqd Take 237 mLs by mouth 2 (two) times daily between meals.   guaiFENesin  600 MG 12 hr tablet Commonly known as: MUCINEX  Take 1 tablet (600 mg total) by mouth 2 (two) times daily for 5 days.   HYDROcodone-acetaminophen  5-325 MG tablet Commonly known as: NORCO/VICODIN Take 1 tablet by mouth every 6 (six) hours as needed for severe pain (pain score 7-10).   pantoprazole 40 MG tablet Commonly known as: PROTONIX Take 40 mg by mouth daily.   polyethylene glycol 17 g packet Commonly known as: MIRALAX  / GLYCOLAX  Take 17 g by mouth every 12 (twelve) hours.   senna-docusate 8.6-50 MG tablet Commonly known as: Senokot-S Take 1 tablet by mouth 2 (two) times daily between meals as needed for mild constipation.   Symbicort  160-4.5 MCG/ACT inhaler Generic drug: budesonide -formoterol  Inhale 2 puffs into the lungs 2 (two) times daily. What changed: when to take this   traZODone  100 MG tablet Commonly known as: DESYREL  Take 100 mg by mouth at bedtime.   Ventolin  HFA 108 (90 Base) MCG/ACT inhaler Generic drug: albuterol  Inhale 2 puffs into the lungs every 6 (six) hours as needed for wheezing or shortness of breath. Replaces: albuterol  (2.5 MG/3ML) 0.083% nebulizer solution   zinc oxide 20 % ointment Apply 1 Application topically daily. To buttocks.               Durable Medical Equipment  (From admission, onward)           Start     Ordered   01/05/24 0727  For home use only DME Walker rolling  Once       Question Answer Comment  Walker: With 5 Inch Wheels   Patient needs a walker to treat with the following condition Generalized weakness      01/05/24 0726               Discharge Care Instructions  (From admission, onward)           Start     Ordered   01/05/24 0000  Discharge wound care:       Comments: Offloading and close monitoring   01/05/24 1258            Consultations: Interventional radiology Pulmonology  Procedures/Studies:   DG CHEST PORT 1 VIEW Result  Date: 01/05/2024 CLINICAL DATA:  Follow-up pneumothorax. EXAM: PORTABLE CHEST 1 VIEW COMPARISON:  01/04/2024 FINDINGS: Stable position of left sided pigtail thoracostomy tube. No appreciable pneumothorax identified. Small left pleural effusion is identified, unchanged. Mild subsegmental atelectasis noted within both lower lobes. No interstitial edema or consolidation. IMPRESSION: 1. Stable position of left sided pigtail thoracostomy tube. No appreciable pneumothorax. 2. Small left pleural effusion. 3. Mild subsegmental atelectasis within the lung bases, unchanged. Electronically Signed   By: Kimberley Penman M.D.   On: 01/05/2024 09:00   DG CHEST PORT 1 VIEW Result Date: 01/04/2024 CLINICAL DATA:  Spontaneous pneumothorax EXAM: PORTABLE CHEST 1 VIEW COMPARISON:  Chest radiograph dated 01/03/2024 FINDINGS: Lines/tubes: Similar position of lateral left pleural catheter. Lungs: Well inflated lungs. Decreased patchy left basilar opacity. Persistent peripheral predominant right lung patchy opacities. Pleura: Unchanged trace left pleural effusion. No definite pneumothorax remains. Heart/mediastinum: The heart size and mediastinal contours are within normal limits. Bones: No acute osseous abnormality. Left chest wall subcutaneous emphysema. IMPRESSION: 1. No definite pneumothorax remains. Unchanged trace left pleural effusion. 2. Decreased patchy left basilar opacity and persistent peripheral predominant right lung patchy opacities, likely atelectasis. Electronically Signed   By: Limin  Xu M.D.   On: 01/04/2024 13:12   DG CHEST PORT 1 VIEW Result Date: 01/03/2024 CLINICAL DATA:  Follow up  pneumothorax. EXAM: PORTABLE CHEST 1 VIEW COMPARISON:  Radiographs 01/02/2024 and 01/01/2024.  CT 01/01/2024. FINDINGS: 0848 hours. Two views are submitted. Interval placement of a small caliber left pleural pigtail chest tube which appears satisfactorily position. Near complete evacuation of the previously demonstrated pneumothorax. Possible minimal residual component superior laterally. Mild atelectasis at both lung bases with a possible small left pleural effusion. The heart size and mediastinal contours are stable. IMPRESSION: Near complete evacuation of the previously demonstrated left pneumothorax following chest tube placement. Possible minimal residual component superolaterally. Electronically Signed   By: Elmon Hagedorn M.D.   On: 01/03/2024 12:34   IR Guided Drain W Catheter Placement Result Date: 01/02/2024 INDICATION: Recurrent left pneumothorax post removal of chest tube EXAM: FLUOROSCOPY GUIDED CHEST TUBE PLACEMENT MEDICATIONS: No periprocedural antibiotics were indicated ANESTHESIA/SEDATION: Intravenous Fentanyl  50mcg and Versed  3mg  were administered by RN during a total moderate (conscious) sedation time of 13 minutes; the patient's level of consciousness and physiological / cardiorespiratory status were monitored continuously by radiology RN under my direct supervision. COMPLICATIONS: None immediate. PROCEDURE: Informed written consent was obtained from the patient after a thorough discussion of the procedural risks, benefits and alternatives. All questions were addressed. Maximal Sterile Barrier Technique was utilized including caps, mask, sterile gowns, sterile gloves, sterile drape, hand hygiene and skin antiseptic. A timeout was performed prior to the initiation of the procedure. Under fluoroscopic guidance, the residual left pneumothorax was identified. Appropriate skin entry site was determined and marked. Region was prepped with chlorhexidine , draped in usual sterile fashion, infiltrated  locally with 1% lidocaine . Percutaneous entry needle was advanced into the pleural space. Gas could be aspirated. Amplatz guidewire advanced easily. Tract dilated to facilitate placement of a 14 French pigtail drain catheter. Catheter was placed to Pleur-Evac suction device. Confirmatory fluoroscopic spot image demonstrated good catheter position. The catheter was secured externally with 0 Prolene suture and StatLock and a sterile dressing applied. The patient tolerated the procedure well. IMPRESSION: 1. Technically successful fluoroscopic-guided left chest tube placement. Electronically Signed   By: Nicoletta Barrier M.D.   On: 01/02/2024 14:45   DG CHEST PORT 1 VIEW Result Date: 01/02/2024 CLINICAL DATA:  Pneumothorax  EXAM: PORTABLE CHEST 1 VIEW COMPARISON:  Yesterday FINDINGS: Left pneumothorax is unchanged, seen from apex to base and measuring up to nearly 3 cm in thickness laterally at the left base. Mild underlying pulmonary opacity from atelectasis by prior CT. Chest wall emphysema on the left. Clear right lung. Normal heart size and mediastinal contours. IMPRESSION: Unchanged moderate left pneumothorax when compared to yesterday. Electronically Signed   By: Ronnette Coke M.D.   On: 01/02/2024 08:19   CT CHEST WO CONTRAST Result Date: 01/01/2024 CLINICAL DATA:  Chest injury, pneumothorax EXAM: CT CHEST WITHOUT CONTRAST TECHNIQUE: Multidetector CT imaging of the chest was performed following the standard protocol without IV contrast. RADIATION DOSE REDUCTION: This exam was performed according to the departmental dose-optimization program which includes automated exposure control, adjustment of the mA and/or kV according to patient size and/or use of iterative reconstruction technique. COMPARISON:  CT 11/30/2023.  Chest x-ray today. FINDINGS: Cardiovascular: Heart is normal size. Aorta is normal caliber. Mediastinum/Nodes: No mediastinal, hilar, or axillary adenopathy. Small amount of pneumomediastinum noted  in the anterior superior mediastinum. Trachea and esophagus are unremarkable. Thyroid  unremarkable. Stoma noted in the lower neck. Lungs/Pleura: Moderate to severe centrilobular and paraseptal emphysema. Bulla in the apices bilaterally. Subsegmental atelectasis in the lower lobes bilaterally. Small to moderate left pneumothorax noted, likely approximately 15-20%. This is slightly smaller than the pneumothorax seen on CT from 11/30/2023. No effusions. Upper Abdomen: No acute findings Musculoskeletal: Left subcutaneous emphysema. No acute bony abnormality. IMPRESSION: Recurrent 15-20% left pneumothorax. This is slightly smaller than the pneumothorax seen on prior CT from 11/30/2023. Small amount of pneumomediastinum noted. Moderate to advanced centrilobular and paraseptal emphysema with upper lobe bulla bilaterally. Electronically Signed   By: Janeece Mechanic M.D.   On: 01/01/2024 23:28   DG Chest Portable 1 View Result Date: 01/01/2024 CLINICAL DATA:  Follow-up left pneumothorax EXAM: PORTABLE CHEST 1 VIEW COMPARISON:  12/01/2023 FINDINGS: Pigtail catheter has been removed on the left. There is a recurrent pneumothorax identified along the apex and laterally on the left. Right lung remains clear. Cardiac shadow is within normal limits. No bony abnormality is seen. IMPRESSION: Recurrent and increased left-sided pneumothorax when compared with the prior exam. Critical Value/emergent results were called by telephone at the time of interpretation on 01/01/2024 at 8:50 pm to Dr. Annita Kindle , who verbally acknowledged these results. Electronically Signed   By: Violeta Grey M.D.   On: 01/01/2024 20:52       The results of significant diagnostics from this hospitalization (including imaging, microbiology, ancillary and laboratory) are listed below for reference.     Microbiology: Recent Results (from the past 240 hours)  MRSA Next Gen by PCR, Nasal     Status: Abnormal   Collection Time: 01/02/24 10:49 PM    Specimen: Nasal Mucosa; Nasal Swab  Result Value Ref Range Status   MRSA by PCR Next Gen DETECTED (A) NOT DETECTED Final    Comment: RESULT CALLED TO, READ BACK BY AND VERIFIED WITH: D DONSON RN 01/03/2024 @ 0106 BY AB (NOTE) The GeneXpert MRSA Assay (FDA approved for NASAL specimens only), is one component of a comprehensive MRSA colonization surveillance program. It is not intended to diagnose MRSA infection nor to guide or monitor treatment for MRSA infections. Test performance is not FDA approved in patients less than 6 years old. Performed at [facility], [address] [place], [place] [address]      Labs:  CBC: Recent Labs  Lab 01/01/24 1940  01/01/24 1942 01/02/24 0555 01/03/24 0314 01/04/24 0247 01/05/24 0326  WBC 13.1*  --  8.7 9.3 9.5 10.6*  NEUTROABS 9.0*  --   --   --   --   --   HGB 14.6 15.0 13.4 12.3* 12.9* 13.5  HCT 45.2 44.0 41.8 39.3 39.7 41.7  MCV 92.1  --  93.9 93.1 92.8 93.3  PLT 351  --  278 262 309 302   BMP &GFR Recent Labs  Lab 01/01/24 1940 01/01/24 1942 01/02/24 0555 01/03/24 0314 01/04/24 0247 01/05/24 0326  NA 138 138 137 140 139 137  K 3.9 3.9 3.2* 4.1 3.8 3.7  CL 106  --  107 105 104 103  CO2 21*  --  24 24 25  21*  GLUCOSE 95  --  89 95 121* 101*  BUN 13  --  11 8 13 10   CREATININE 0.89  --  0.85 1.10 1.13 0.87  CALCIUM 9.7  --  8.9 8.8* 8.8* 9.1   Estimated Creatinine Clearance: 102.6 mL/min (by C-G formula based on SCr of 0.87 mg/dL). Liver & Pancreas: Recent Labs  Lab 01/01/24 1940 01/02/24 0555  AST 19 16  ALT 38 29  ALKPHOS 113 86  BILITOT 0.7 0.7  PROT 7.6 6.3*  ALBUMIN 3.3* 2.8*   No results for input(s): "LIPASE", "AMYLASE" in the last 168 hours. No results for input(s): "AMMONIA" in the last 168 hours. Diabetic: No results for input(s): "HGBA1C" in the last 72 hours. No results for input(s): "GLUCAP" in the last 168 hours. Cardiac Enzymes: No results for input(s): "CKTOTAL", "CKMB", "CKMBINDEX",  "TROPONINI" in the last 168 hours. No results for input(s): "PROBNP" in the last 8760 hours. Coagulation Profile: Recent Labs  Lab 01/01/24 1940  INR 1.0   Thyroid  Function Tests: No results for input(s): "TSH", "T4TOTAL", "FREET4", "T3FREE", "THYROIDAB" in the last 72 hours. Lipid Profile: No results for input(s): "CHOL", "HDL", "LDLCALC", "TRIG", "CHOLHDL", "LDLDIRECT" in the last 72 hours. Anemia Panel: No results for input(s): "VITAMINB12", "FOLATE", "FERRITIN", "TIBC", "IRON", "RETICCTPCT" in the last 72 hours. Urine analysis: No results found for: "COLORURINE", "APPEARANCEUR", "LABSPEC", "PHURINE", "GLUCOSEU", "HGBUR", "BILIRUBINUR", "KETONESUR", "PROTEINUR", "UROBILINOGEN", "NITRITE", "LEUKOCYTESUR" Sepsis Labs: Invalid input(s): "PROCALCITONIN", "LACTICIDVEN"   SIGNED:  Theadore Finger, MD  [facility] 01/05/2024, 3:25 PM

## 2024-01-05 NOTE — Progress Notes (Signed)
 PCCM Update:  Chest x-ray stable on clamp trial. Safe to remove chest tube.  Duaine German, MD  Pulmonary & Critical Care Office: [phone]   See Amion for personal pager PCCM on call pager [phone] until 7pm. Please call Elink 7p-7a. [phone]

## 2024-01-05 NOTE — TOC Transition Note (Signed)
 Transition of Care [facility]) - Discharge Note   Patient Details  Name: Matheau Orona MRN: [id number] Date of Birth: [birth year]  Transition of Care [facility]) CM/SW Contact:  Omie Bickers, RN Phone Number: 01/05/2024, 9:28 AM   Clinical Narrative:     Geralene Knack, they will the patent after his scheduled appointment on 6/5 to re establish w his PCP.  RW ordered to be delivered to the room.   Final next level of care: Home w Home Health Services Barriers to Discharge: Continued Medical Work up   Patient Goals and CMS Choice Patient states their goals for this hospitalization and ongoing recovery are:: return home CMS Medicare.gov Compare Post Acute Care list provided to:: Patient Choice offered to / list presented to : Patient      Discharge Placement                       Discharge Plan and Services Additional resources added to the After Visit Summary for   In-house Referral: NA Discharge Planning Services: CM Consult Post Acute Care Choice: Home Health          DME Arranged: N/A         HH Arranged: PT HH Agency: [facility] Date [facility] Agency Contacted: 01/04/24 Time HH Agency Contacted: 1650 Representative spoke with at [facility] Agency: Randel Buss  Social Drivers of Health (SDOH) Interventions SDOH Screenings   Food Insecurity: No Food Insecurity (01/02/2024)  Housing: Low Risk  (01/02/2024)  Transportation Needs: No Transportation Needs (01/02/2024)  Utilities: Not At Risk (01/02/2024)  Tobacco Use: High Risk (01/02/2024)     Readmission Risk Interventions     No data to display

## 2024-01-05 NOTE — Telephone Encounter (Signed)
 Please schedule patient for hospital follow up with me for pneumothorax with chest x-ray in 1 month.  Thanks, JD

## 2024-01-09 ENCOUNTER — Other Ambulatory Visit: Payer: Self-pay

## 2024-01-09 ENCOUNTER — Ambulatory Visit

## 2024-01-09 ENCOUNTER — Encounter: Payer: Self-pay | Admitting: Pulmonary Disease

## 2024-01-09 DIAGNOSIS — J939 Pneumothorax, unspecified: Secondary | ICD-10-CM

## 2024-01-11 ENCOUNTER — Encounter: Payer: Self-pay | Admitting: Pulmonary Disease

## 2024-01-25 ENCOUNTER — Encounter: Payer: Self-pay | Admitting: Internal Medicine

## 2024-01-25 ENCOUNTER — Ambulatory Visit: Admitting: Internal Medicine

## 2024-01-25 VITALS — BP 120/78 | HR 86 | Temp 98.7°F | Resp 17 | Ht 70.0 in | Wt 192.4 lb

## 2024-01-25 DIAGNOSIS — S31000A Unspecified open wound of lower back and pelvis without penetration into retroperitoneum, initial encounter: Secondary | ICD-10-CM | POA: Insufficient documentation

## 2024-01-25 DIAGNOSIS — Z8709 Personal history of other diseases of the respiratory system: Secondary | ICD-10-CM | POA: Insufficient documentation

## 2024-01-25 DIAGNOSIS — R52 Pain, unspecified: Secondary | ICD-10-CM

## 2024-01-25 DIAGNOSIS — J449 Chronic obstructive pulmonary disease, unspecified: Secondary | ICD-10-CM

## 2024-01-25 DIAGNOSIS — S31000D Unspecified open wound of lower back and pelvis without penetration into retroperitoneum, subsequent encounter: Secondary | ICD-10-CM

## 2024-01-25 DIAGNOSIS — Z6827 Body mass index (BMI) 27.0-27.9, adult: Secondary | ICD-10-CM

## 2024-01-25 HISTORY — DX: Chronic obstructive pulmonary disease, unspecified: J44.9

## 2024-01-25 HISTORY — DX: Unspecified open wound of lower back and pelvis without penetration into retroperitoneum, initial encounter: S31.000A

## 2024-01-25 HISTORY — DX: Pain, unspecified: R52

## 2024-01-25 HISTORY — DX: Personal history of other diseases of the respiratory system: Z87.09

## 2024-01-25 MED ORDER — ATORVASTATIN CALCIUM 10 MG PO TABS: 10.0000 mg | ORAL_TABLET | Freq: Every day | ORAL | 1 refills | Status: DC

## 2024-01-25 MED ORDER — BUDESONIDE-FORMOTEROL FUMARATE 160-4.5 MCG/ACT IN AERO: 2.0000 | INHALATION_SPRAY | Freq: Two times a day (BID) | RESPIRATORY_TRACT | 1 refills | Status: DC

## 2024-01-25 MED ORDER — DICLOFENAC SODIUM 50 MG PO TBEC: 50.0000 mg | DELAYED_RELEASE_TABLET | Freq: Two times a day (BID) | ORAL | 2 refills | Status: DC

## 2024-01-25 MED ORDER — PANTOPRAZOLE SODIUM 40 MG PO TBEC: 40.0000 mg | DELAYED_RELEASE_TABLET | Freq: Every day | ORAL | 1 refills | Status: DC

## 2024-01-25 MED ORDER — HYDROCODONE-ACETAMINOPHEN 5-325 MG PO TABS: 1.0000 | ORAL_TABLET | Freq: Four times a day (QID) | ORAL | 0 refills | Status: DC | PRN

## 2024-01-25 MED ORDER — ALBUTEROL SULFATE HFA 108 (90 BASE) MCG/ACT IN AERS: 2.0000 | INHALATION_SPRAY | Freq: Four times a day (QID) | RESPIRATORY_TRACT | 2 refills | Status: DC | PRN

## 2024-01-25 MED ORDER — TAMSULOSIN HCL 0.4 MG PO CAPS
0.4000 mg | ORAL_CAPSULE | Freq: Every day | ORAL | 5 refills | Status: DC
Start: 2024-01-25 — End: 2024-01-31

## 2024-01-25 NOTE — Assessment & Plan Note (Signed)
 He was sent out of the hospital per him and his girlfriend with no medications and no plan for his wound care.  He is taking 6-8 ibuprofen per day for pain.  I want him to cut back on this and we will refill his hydrocodone/APAP.  His pain is related to his chest tube site which is healing well and his unstageable sacral wound to his crease of his buttocks.

## 2024-01-25 NOTE — Progress Notes (Signed)
 Office Visit  Subjective   Patient ID: Robert Erickson   DOB: 1970-09-22   Age: 53 y.o.   MRN: [id number]   Chief Complaint Chief Complaint  Patient presents with   New Patient (Initial Visit)     History of Present Illness The patient is a 53 yo male who comes in today for a hospital followup where he was at [facility] from 12/01/2023 until 01/01/2024 where he was subsequently transferred to [facility] and stayed there from 01/01/2024 until 01/05/2024.  The patient was initially transferred from [facility] where he was admitted there on 11/24/2023 with shortness of breath.  The patient's past medical history was notable for heavy tobacco use and COPD where he presented to the [facility] Emergency room with progressively worsening shortness of breath.  He began feeling sick about 3 weeks prior with Respiratory infection symptoms including fevers, chills, myalgias, shortness of breath, nonproductive cough, and pleuritic chest discomfort.  He had quit smoking 3 weeks prior when he became ill but had been smoking up to per day for year.  On EMS arrival to the ER, the patient was noted to have an oxygen sat of 84% on room air with notable respiratory distress.  He was placed on a non-rebreather and on a upon arrival he was satting at 92% with visible respiratory distress.  He had tachycardia with tachypnea and was afebrile.  His white blood cell count was noted to be 23 K with a proBNP elevated at 5750.  His respiratory viral panel was negative for flu, RSV and COVID.  An ABG done at that time was noted to have a pH of 7.47 with a pCO2 of 27 and a PO2 of 63 on a non-rebreather.  His oxygen some slightly dropped to 54 on a Venti mask and they switched him to a high-flow nasal cannula.  A CTA of his chest was performed that showed no evidence of pulmonary embolism but he had a dilated pulmonary artery noted with mild pulmonary vascular congestion and extensive emphysema and chronic  interstitial changes with enlarged mediastinal and hilar lymphadenopathy.  The patient was started on IV fluids, antibiotics and IV steroids with neb treatments.  He was admitted to hospital for acute hypoxic respiratory failure due to multifocal pneumonia with COPD exacerbation.  Pulmonary Medicine was consulted during this time and on day 3 of his hospital course he had a further decline in his respiratory status.  He was placed on BiPAP and transferred to the ICU where repeat chest x-ray demonstrated findings of a tension pneumothorax.  He had a urgent pigtail catheter placed for decompression of his pneumothorax.  He was subsequently intubated prior to placement of a chest tube.  They noted he had remained with a chest tube and had continuous air leak during his stay in the ICU.  He completed a course of antibiotics.  He continued to have a persistent air leak with Pulmonary Medicine recommend transferred to a facility with CT surgery.  He did speak to Pulmonary critical Care and Cardiothoracic surgery atrium where the CT surgery reviewed his CT of his chest and did not feel the patient was a candidate for any type of surgical intervention at that time.  The patient later that afternoon self extubated.  He was initially placed on a non-rebreather and subsequently transitioned to a high-flow nasal cannula.  Given his ongoing pulmonary management needs including possibility of a larger chest tube placement in his ongoing need for supplemental oxygen, the  patient was accepted to LTAC for further care.  [The patient was admitted to [facility] on 12/01/2023.  They transported him on high-flow nasal cannula at 15 L with a non-rebreather.  Upon presentation to [facility] he was alert oriented x2.  He had a left chest tube that was on suction and that was previously at water seal at the previous facility.  Upon arrival the patient was noted to be having a respiratory rate in the 30s and he was noted to have alkalosis  on his blood gas.  We decided to do elective intubation on 12/01/2023 which was performed by Anesthesiology and he was placed back on sedation.  We had difficulties with sedation where we placed him on morphine  IR every 6 hours and Psychiatry placed him on clonazepam  and Seroquel as needed which helped with his sedation.  The patient was unable to be weaned from the ventilator so he underwent tracheostomy placement on 12/07/2023.  He had some bleeding around his trach right after his procedure but this did resolve.  The patient was started on T-bar trials on 12/14/2023.  ENT saw him on 12/17/2023 and did a fees test where they downgraded his tube and deflated his tracheostomy cuff.  They saw him again on 12/19/2023 and did a repeat fees and they noted that his trach tube was decannulated as it was a little too loose around his neck.  This was corrected his trach tube was line in good position without any obvious signs of obstruction.  He did well and was eventually capped on 12/20/2023 and was subsequently decannulated on 12/24/2023.  He has remained on room air and has been stable on room air since this time.  General surgery followed along for his history of tension pneumothorax with a chest tube.  His chest tube was on suction and General surgery noted he continued to have a chest tube leak when they initially saw him on 12/17/2023.  We are not sure where the leak could be and we are not sure whether not he may have a bronchopleural fistula.  We tried to place a chest tube to water seal on 12/17/2023 but he was noted to still have air leak and began having worsening subcutaneous emphysema.  We therefore placed a chest tube back to suction.  He was eventually switched back over to water seal and has done well over last week.  He had a CT scan of his chest done on 12/30/2023 and this showed chronic emphysema of the bilateral lungs with subcutaneous emphysema of the chest wall, left greater than right with a  left-sided chest tube in place with anterior mediastinal emphysema.  General surgery evaluated him this morning on 01/01/2024 and pulled his chest tube.  Clinically, the patient is doing well without respiratory distress he remains on room air where he is satting at 94%.  We however obtained a chest x-ray after pulling his chest tube and this chest x-ray on 01/01/2024 shows a large left pneumothorax.  I had a discussion with General surgery and at this point we think he needs to be evaluated by Cardiothoracic surgery and Ca continues to have an air leak.  During his hospital stay here at [facility] we had him finished a course of antibiotics for his pneumonia from the outside hospital and he has remained afebrile.  He has had some depression anxiety and psychiatry was consulted where they discontinued his Seroquel and placed him on Zyprexa and changed to clonazepam  to lorazepam  and continued his  other psych medications.  He is also noted to have a deep tissue injury to his sacrum that was present on admission.  Wound Care has been following him and this is slowly improved.  The patient presented with NG-tube upon admission to [facility] where he was receiving tube feeds.  A fees test done on 12/19/2023 showed he had a very effective swallow and could handle thin liquids and regular diet and we started him on a diet at that time.  Physical Medicine Rehab with therapies have been following him and he has been walking in the hallways up to 1200 ft in preparation to go home]  He was transferred to [facility] where he stayed there from 01/01/2024 until 01/05/2024.  Again, the patient had been transferred from [facility].  The chest tube has been removed today morning at the LTAC and patient found to have recurrent pneumothorax so he has been sent to the ED for cardiothoracic/pulmonary evaluation as we felt he had an air leak.  His chest x-ray showed recurrent and increased left-sided pneumothorax as compared to  prior exam.  A CT of chest was performed and showed a recurrent 15 to 20% left pneumothorax slightly smaller than the pneumothorax seen on prior CT on 11/30/2023, small amount of pneumomediastinum, moderate to advanced centrilobular and paraseptal emphysema with upper lobe bulla bilaterally.  IR consulted and he had fluoroscopy guided left chest tube placement.  Pulmonology consulted. Eventually, pneumothorax resolved.  His chest tube removed on 01/05/2024 and he was cleared for discharge by pulmonology.  Pulmonology to arrange outpatient follow-up.  He was discharged with stage 2 bilateral buttocks and an unstageable sacral pressure ulcer.  He states he was discharged from [facility] with no care instructions for his wounds and was discharged without any prescriptions.    His Medications at discharge were:  HYDROcodone/APAP 5 MG-325 MG 1 Tablet PRNq6h Oral  Escitalopram  Oxalate 20 MG hs Oral    Pantoprazole 40 MG daily1 Oral  Mometasone /Formot 100MCG-5MCG 1 EA q12h Inhalation  traZODone  Hydrochloride 100 MG hs Oral  Albuterol  Sulfate 0.083% 2.5 MG PRNq6h Inhalation  Atorvastatin Calcium 10 mg hs Oral  buPROPion  hydrochloride 200 MG daily1 Oral  Clonazepam  0.5mg  po 1-2 in AM  Adderall 10mg  daily  Doxazosin Mesylate 2 mg hs Oral   He states at home he is supposed to be on:  Flomax 0.4mg  daily  Voltaren ER 50mg  daily  Cozaar 25mg  daily  Dulera  200-5 2 puffs BID   The patient was seen by Pulmonary as an outpatient on 01/09/2024 but was not seen by the pulmonologist.  He states they did a CXR on 01/09/2024 and on review of his chart, this showed no evidence of pneumothorax.  He also sees Dr. Dania Dupre who manages his anxiety and ADHD.  He had prescriptions for some of his meds including his bupropion , adderall, klonopin  and trazodone  and celexa.  Today, the patient states he is having problems with urination.  He states that his wounds are hurting as well.  His stoma wound is healed.  He has  a unstagable wound to the crease of his buttocks they have been putting zinc oxide on.  He is having a lot of pain from these wounds.  He has an appointment with his behavioral health doctor on 02/08/2024.      Past Medical History Past Medical History:  Diagnosis Date   Anxiety    Arthritis    Asthma    Depression    Elevated cholesterol  Hypertension    Kidney stone    Obesity    Sleep apnea    does have a cpap machine     Allergies Allergies  Allergen Reactions   Shellfish Allergy Anaphylaxis   Shellfish-Derived Products Anaphylaxis   Aspirin Rash   Penicillin G Rash     Medications  Current Outpatient Medications:    acetaminophen  (TYLENOL ) 650 MG CR tablet, Take 650 mg by mouth every 6 (six) hours as needed for pain or fever., Disp: , Rfl:    albuterol  (VENTOLIN  HFA) 108 (90 Base) MCG/ACT inhaler, Inhale 2 puffs into the lungs every 6 (six) hours as needed for wheezing or shortness of breath., Disp: 18 g, Rfl: 2   amphetamine -dextroamphetamine  (ADDERALL) 10 MG tablet, 1 tablet daily. (Patient not taking: Reported on 01/02/2024), Disp: , Rfl:    atorvastatin (LIPITOR) 10 MG tablet, Take 10 mg by mouth at bedtime., Disp: , Rfl:    buPROPion  (WELLBUTRIN  SR) 200 MG 12 hr tablet, Take 200 mg by mouth daily., Disp: , Rfl:    clonazePAM  (KLONOPIN ) 0.5 MG tablet, Take 0.5 mg by mouth as directed. Take 1 in the morning and 2 in the afternoon (Patient not taking: Reported on 01/02/2024), Disp: , Rfl:    escitalopram  (LEXAPRO ) 20 MG tablet, Take 20 mg by mouth at bedtime., Disp: , Rfl:    feeding supplement (ENSURE ENLIVE / ENSURE PLUS) LIQD, Take 237 mLs by mouth 2 (two) times daily between meals., Disp: , Rfl:    HYDROcodone-acetaminophen  (NORCO/VICODIN) 5-325 MG tablet, Take 1 tablet by mouth every 6 (six) hours as needed for severe pain (pain score 7-10)., Disp: , Rfl:    pantoprazole (PROTONIX) 40 MG tablet, Take 40 mg by mouth daily., Disp: , Rfl:    polyethylene glycol  (MIRALAX  / GLYCOLAX ) 17 g packet, Take 17 g by mouth every 12 (twelve) hours., Disp: , Rfl:    senna-docusate (SENOKOT-S) 8.6-50 MG tablet, Take 1 tablet by mouth 2 (two) times daily between meals as needed for mild constipation., Disp: , Rfl:    traZODone  (DESYREL ) 100 MG tablet, Take 100 mg by mouth at bedtime., Disp: , Rfl:    zinc oxide 20 % ointment, Apply 1 Application topically daily. To buttocks., Disp: , Rfl:    Review of Systems Review of Systems  Constitutional:  Negative for chills and fever.  Eyes:  Negative for blurred vision and double vision.  Respiratory:  Positive for cough and shortness of breath. Negative for hemoptysis and wheezing.   Cardiovascular:  Negative for chest pain, palpitations and leg swelling.       Chest pain related to his chest tube wound  Gastrointestinal:  Positive for diarrhea. Negative for abdominal pain, constipation, nausea and vomiting.  Neurological:  Positive for dizziness. Negative for weakness and headaches.       Objective:    Vitals BP 120/78   Pulse 86   Temp 98.7 F (37.1 C)   Resp 17   Ht 5\' 10"  (1.778 m)   Wt 192 lb 6.4 oz (87.3 kg)   SpO2 91%   BMI 27.61 kg/m    Physical Examination Physical Exam Constitutional:      Appearance: Normal appearance. He is not ill-appearing.  Cardiovascular:     Rate and Rhythm: Normal rate and regular rhythm.     Pulses: Normal pulses.     Heart sounds: No murmur heard.    No friction rub. No gallop.  Pulmonary:     Effort: Pulmonary effort  is normal. No respiratory distress.     Breath sounds: No wheezing, rhonchi or rales.  Abdominal:     General: Abdomen is flat. Bowel sounds are normal. There is no distension.     Palpations: Abdomen is soft.     Tenderness: There is no abdominal tenderness.  Musculoskeletal:     Right lower leg: No edema.     Left lower leg: No edema.  Skin:    General: Skin is warm and dry.     Findings: No rash.  Neurological:     Mental Status: He is  alert.        Assessment & Plan:   Acute pain He was sent out of the hospital per him and his girlfriend with no medications and no plan for his wound care.  He is taking 6-8 ibuprofen per day for pain.  I want him to cut back on this and we will refill his hydrocodone/APAP.  His pain is related to his chest tube site which is healing well and his unstageable sacral wound to his crease of his buttocks.  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  He was sent to [facility] and they placed another chest tube.  They pulled this and did a repeat CXR and there was no leak and no PTX.  He had a followup CXR after discharge at the pulmonary office and this showed no recurrent PTX.  Sacral wound I am going to refer him to wound care for his sacral wound.  His stoma and left chest tube site are healed over.    Chronic obstructive pulmonary disease (HCC) I am going to start him on back on some medications.  He will need to followup with Dr. Arita Belch for followup.  We will restart him on symbicort  as his insurance will not cover dulera .    Return in about 4 weeks (around 02/22/2024).   Wayne Haines, MD

## 2024-01-25 NOTE — Assessment & Plan Note (Signed)
 I am going to start him on back on some medications.  He will need to followup with Dr. Arita Belch for followup.  We will restart him on symbicort  as his insurance will not cover dulera .

## 2024-01-25 NOTE — Assessment & Plan Note (Signed)
 I am going to refer him to wound care for his sacral wound.  His stoma and left chest tube site are healed over.

## 2024-01-25 NOTE — Assessment & Plan Note (Signed)
 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  He was sent to [facility] and they placed another chest tube.  They pulled this and did a repeat CXR and there was no leak and no PTX.  He had a followup CXR after discharge at the pulmonary office and this showed no recurrent PTX.

## 2024-01-29 ENCOUNTER — Other Ambulatory Visit: Payer: Self-pay | Admitting: Internal Medicine

## 2024-01-29 MED ORDER — CLONAZEPAM 0.5 MG PO TABS: ORAL_TABLET | ORAL | 1 refills | Status: DC

## 2024-01-29 MED ORDER — ESCITALOPRAM OXALATE 20 MG PO TABS: 20.0000 mg | ORAL_TABLET | Freq: Every day | ORAL | 1 refills | Status: DC

## 2024-01-29 MED ORDER — BUPROPION HCL ER (SR) 200 MG PO TB12: 200.0000 mg | ORAL_TABLET | Freq: Every day | ORAL | 1 refills | Status: DC

## 2024-01-31 ENCOUNTER — Emergency Department (HOSPITAL_COMMUNITY)
Admission: EM | Admit: 2024-01-31 | Discharge: 2024-01-31 | Disposition: A | Discharge status: Home / Self Care | Attending: Emergency Medicine | Admitting: Emergency Medicine

## 2024-01-31 ENCOUNTER — Emergency Department (HOSPITAL_COMMUNITY)

## 2024-01-31 ENCOUNTER — Ambulatory Visit: Admitting: Internal Medicine

## 2024-01-31 ENCOUNTER — Encounter: Payer: Self-pay | Admitting: Internal Medicine

## 2024-01-31 VITALS — BP 130/92 | HR 101 | Temp 98.6°F | Resp 20 | Ht 70.0 in | Wt 204.0 lb

## 2024-01-31 DIAGNOSIS — R197 Diarrhea, unspecified: Secondary | ICD-10-CM | POA: Insufficient documentation

## 2024-01-31 DIAGNOSIS — J4489 Other specified chronic obstructive pulmonary disease: Secondary | ICD-10-CM | POA: Diagnosis not present

## 2024-01-31 DIAGNOSIS — L89150 Pressure ulcer of sacral region, unstageable: Secondary | ICD-10-CM | POA: Insufficient documentation

## 2024-01-31 DIAGNOSIS — F410 Panic disorder [episodic paroxysmal anxiety] without agoraphobia: Secondary | ICD-10-CM | POA: Insufficient documentation

## 2024-01-31 DIAGNOSIS — R509 Fever, unspecified: Secondary | ICD-10-CM

## 2024-01-31 DIAGNOSIS — F411 Generalized anxiety disorder: Secondary | ICD-10-CM

## 2024-01-31 DIAGNOSIS — Z9981 Dependence on supplemental oxygen: Secondary | ICD-10-CM | POA: Diagnosis not present

## 2024-01-31 DIAGNOSIS — R799 Abnormal finding of blood chemistry, unspecified: Secondary | ICD-10-CM | POA: Diagnosis present

## 2024-01-31 DIAGNOSIS — E876 Hypokalemia: Secondary | ICD-10-CM | POA: Diagnosis not present

## 2024-01-31 DIAGNOSIS — L89152 Pressure ulcer of sacral region, stage 2: Secondary | ICD-10-CM | POA: Insufficient documentation

## 2024-01-31 DIAGNOSIS — F419 Anxiety disorder, unspecified: Secondary | ICD-10-CM | POA: Insufficient documentation

## 2024-01-31 HISTORY — DX: Generalized anxiety disorder: F41.1

## 2024-01-31 HISTORY — DX: Diarrhea, unspecified: R19.7

## 2024-01-31 HISTORY — DX: Panic disorder (episodic paroxysmal anxiety): F41.0

## 2024-01-31 HISTORY — DX: Pressure ulcer of sacral region, unstageable: L89.150

## 2024-01-31 HISTORY — DX: Fever, unspecified: R50.9

## 2024-01-31 LAB — CBC WITH DIFFERENTIAL/PLATELET
Abs Immature Granulocytes: 0.07 10*3/uL (ref 0.00–0.07)
Basophils Absolute: 0.1 10*3/uL (ref 0.0–0.1)
Basophils Relative: 0 %
Eosinophils Absolute: 0.2 10*3/uL (ref 0.0–0.5)
Eosinophils Relative: 1 %
HCT: 38.9 % — ABNORMAL LOW (ref 39.0–52.0)
Hemoglobin: 12.6 g/dL — ABNORMAL LOW (ref 13.0–17.0)
Immature Granulocytes: 1 %
Lymphocytes Relative: 12 %
Lymphs Abs: 1.4 10*3/uL (ref 0.7–4.0)
MCH: 30.4 pg (ref 26.0–34.0)
MCHC: 32.4 g/dL (ref 30.0–36.0)
MCV: 94 fL (ref 80.0–100.0)
Monocytes Absolute: 0.4 10*3/uL (ref 0.1–1.0)
Monocytes Relative: 3 %
Neutro Abs: 10.4 10*3/uL — ABNORMAL HIGH (ref 1.7–7.7)
Neutrophils Relative %: 83 %
Platelets: 398 10*3/uL (ref 150–400)
RBC: 4.14 MIL/uL — ABNORMAL LOW (ref 4.22–5.81)
RDW: 15 % (ref 11.5–15.5)
WBC: 12.5 10*3/uL — ABNORMAL HIGH (ref 4.0–10.5)
nRBC: 0 % (ref 0.0–0.2)

## 2024-01-31 LAB — COMPREHENSIVE METABOLIC PANEL WITH GFR
ALT: 10 U/L (ref 0–44)
AST: 20 U/L (ref 15–41)
Albumin: 2.1 g/dL — ABNORMAL LOW (ref 3.5–5.0)
Alkaline Phosphatase: 120 U/L (ref 38–126)
Anion gap: 8 (ref 5–15)
BUN: 9 mg/dL (ref 6–20)
CO2: 20 mmol/L — ABNORMAL LOW (ref 22–32)
Calcium: 7.9 mg/dL — ABNORMAL LOW (ref 8.9–10.3)
Chloride: 110 mmol/L (ref 98–111)
Creatinine, Ser: 0.96 mg/dL (ref 0.61–1.24)
GFR, Estimated: >60 mL/min (ref >60)
Glucose, Bld: 104 mg/dL — ABNORMAL HIGH (ref 70–99)
Potassium: 3.2 mmol/L — ABNORMAL LOW (ref 3.5–5.1)
Sodium: 138 mmol/L (ref 135–145)
Total Bilirubin: 0.6 mg/dL (ref 0.0–1.2)
Total Protein: 6.6 g/dL (ref 6.5–8.1)

## 2024-01-31 LAB — PROTIME-INR
INR: 1.2 (ref 0.8–1.2)
Prothrombin Time: 15.5 s — ABNORMAL HIGH (ref 11.4–15.2)

## 2024-01-31 LAB — I-STAT CG4 LACTIC ACID, ED: Lactic Acid, Venous: 1.6 mmol/L (ref 0.5–1.9)

## 2024-01-31 MED ORDER — OXYCODONE HCL 5 MG PO TABS: 5.0000 mg | ORAL_TABLET | Freq: Four times a day (QID) | ORAL | 0 refills | Status: DC | PRN

## 2024-01-31 MED ORDER — POTASSIUM CHLORIDE ER 10 MEQ PO TBCR: 10.0000 meq | EXTENDED_RELEASE_TABLET | Freq: Every day | ORAL | 0 refills | Status: DC

## 2024-01-31 MED ORDER — PANTOPRAZOLE SODIUM 40 MG PO TBEC: 40.0000 mg | DELAYED_RELEASE_TABLET | Freq: Every day | ORAL | 1 refills | Status: AC

## 2024-01-31 MED ORDER — TAMSULOSIN HCL 0.4 MG PO CAPS: 0.4000 mg | ORAL_CAPSULE | Freq: Every day | ORAL | 1 refills | Status: DC

## 2024-01-31 MED ORDER — DOXYCYCLINE HYCLATE 100 MG PO CAPS
100.0000 mg | ORAL_CAPSULE | Freq: Two times a day (BID) | ORAL | 0 refills | Status: AC
Start: 2024-01-31 — End: 2024-02-07

## 2024-01-31 MED ORDER — DICLOFENAC SODIUM 50 MG PO TBEC: 50.0000 mg | DELAYED_RELEASE_TABLET | Freq: Two times a day (BID) | ORAL | 2 refills | Status: DC

## 2024-01-31 MED ORDER — BUPROPION HCL ER (SR) 100 MG PO TB12: 100.0000 mg | ORAL_TABLET | Freq: Every day | ORAL | 2 refills | Status: AC

## 2024-01-31 MED ORDER — ESCITALOPRAM OXALATE 20 MG PO TABS: 20.0000 mg | ORAL_TABLET | Freq: Every day | ORAL | 1 refills | Status: DC

## 2024-01-31 MED ORDER — MEDIHONEY WOUND/BURN DRESSING EX GEL: 1.0000 | Freq: Every day | CUTANEOUS | 0 refills | Status: AC

## 2024-01-31 MED ORDER — CLONAZEPAM 1 MG PO TABS: 1.0000 mg | ORAL_TABLET | Freq: Two times a day (BID) | ORAL | 1 refills | Status: DC | PRN

## 2024-01-31 MED ORDER — ATORVASTATIN CALCIUM 10 MG PO TABS: 10.0000 mg | ORAL_TABLET | Freq: Every day | ORAL | 1 refills | Status: AC

## 2024-01-31 NOTE — Discharge Instructions (Addendum)
 Please follow up with your primary care doctor's office.  It is very important that you turn in the stool studies as it is possible.  Certain types of gut infections require specific antibiotics.  I gave you a printed prescription for an antibiotic called doxycycline.  You can begin taking this if you have fevers with coughing or chest pain at home.  This is treatment for possible pneumonia.  You also had blood cultures drawn in the emergency department today.  If your cultures are positive and growing bacteria, you will receive a phone call telling you to return to the emergency room immediately for admission

## 2024-01-31 NOTE — ED Provider Triage Note (Signed)
 Emergency Medicine Provider Triage Evaluation Note  Robert Erickson , a 53 y.o. male  was evaluated in triage.  Pt complains of his area of trauma, spent a month in [facility] discharged in late April.  Sent in for evaluation of highly elevated white blood cell count.  He has chronic sacral wound that he is managing at outpatient wound center clearly anxious.  Review of Systems  Positive: White blood cell count Negative: Fever  Physical Exam  There were no vitals taken for this visit. Gen:   Awake, no distress   Resp:  Normal effort  MSK:   Moves extremities without difficulty  Other:    Medical Decision Making  Medically screening exam initiated at 3:25 PM.  Appropriate orders placed.  Robert Erickson was informed that the remainder of the evaluation will be completed by another provider, this initial triage assessment does not replace that evaluation, and the importance of remaining in the ED until their evaluation is complete.     Tama Fails, PA-C 01/31/24 1528

## 2024-01-31 NOTE — Assessment & Plan Note (Signed)
 His anxiety is out of control.  I had a discussion with him to go see the urgent visit with his psychiatry in [facility] but he refuses.  His trazodone  is making him have restless leg and feelings of crawling skin.  He is to stop the trazodone  and we will decreased his wellbutrin  SR from 200mg  to 100mg  daily.  He is to continue on lexapro  20mg  daily and I will increase his clonazepam  from 0.5mg  BID prn to 1mg  BID prn.  He can use the clonazepam  for sleep.  He is not to take his pain medicine and clonazepam  together and this was discussed.

## 2024-01-31 NOTE — Assessment & Plan Note (Addendum)
 He states he is having fever at night but he is afebrile here.  I am going to obtain a CBC, CMP and C. Diff.  We will also check a UA as he states he is having some problems with urinating.  I will also obtain a repeat CXR.

## 2024-01-31 NOTE — Progress Notes (Signed)
 Office Visit  Subjective   Patient ID: Robert Erickson   DOB: Jan 03, 1971   Age: 53 y.o.   MRN: [id number]   Chief Complaint Chief Complaint  Patient presents with   Acute Visit    Pt reports he has a lesion on his buttocks, also was prescribed pain medication that  is not helping but the side effects ae making his anxiety worse.      History of Present Illness Robert Erickson is a 53 yo male who returns today for multiple continued problems.  His anxiety has worsened since he has come out of the hospital.  He states that the prn clonazepam  has not been helping.  He is currently on wellbutrin  SR 200mg  daily, lexapro  20mg  daily, trazodone  100mg  at bedtime and clonazepam  0.5mg  po BID prn.  He tells me that he cannot sleep at night and the trazodone  is causing him to have worsening shaking and making his "skin crawl"/restless leg.  He has stopped his adderal.  He is having panic attacks maybe twice a day.  He has been taking his clonazepam  BID prn where he takes it twice a day.  Psychiatry was consulted during his stay at [facility] where they discontinued his Seroquel and placed him on Zyprexa and changed to clonazepam  to lorazepam  and continued his other psych medications.  They then changed his meds as described on his last note.  He states he is not having worsening depression but his anxiety is out of control.  He is followed by [facility] with Dr. Dania Dupre and he has an appointment on 02/08/2024.    The patient also comes in today stating he is having fever at night where he is getting up 102 which has been sporadic for the last 1.5 weeks.  He is having SOB with exertion and he is also telling me he is having diarrhea since he got out of the hospital.  He does not see blood in his diarrhea but he wonders if he has mucus in his stools.  The patient has been taking immodium which has not helped.  The patient is having cough productive of yellow sputum.  He did see Dr. Arita Belch yesterday who  is setting him up for oxygen and he has been taking guaifenesin /dextromorphan for his cough.  Dr. Arita Belch stopped his dulera /symbicort  and started him on breztri inhaler.  He goes back to see Dr. Arita Belch in 2 weeks.  The patient also comes in stating he is having drainage from unstageable pressure ulcer of his sacrum that was present on admission to [facility].  Wound Care followed him and this is slowly improved.  Physical Medicine Rehab with therapies have been following him and he has been walking in the hallways up to 1200 ft in preparation to go home.  He saw me last week and we looked at his wound and it did not look infected and we referred him to [facility] where he goes to see them on 02/08/2024.  He tells me that over the interim, he is having problems with drainage from this wound and he wonders if he has an abscess as he notices yellow drainage on his clothes.  He is still having pain and has had problems with hydrocodone/APAP where it makes him itch and have hives all over.       Past Medical History Past Medical History:  Diagnosis Date   Anxiety    Arthritis    Asthma    Depression  Elevated cholesterol    Hypertension    Kidney stone    Obesity    Sleep apnea    does have a cpap machine     Allergies Allergies  Allergen Reactions   Shellfish Allergy Anaphylaxis   Shellfish-Derived Products Anaphylaxis   Aspirin Rash   Penicillin G Rash     Medications  Current Outpatient Medications:    acetaminophen  (TYLENOL ) 650 MG CR tablet, Take 650 mg by mouth every 6 (six) hours as needed for pain or fever., Disp: , Rfl:    albuterol  (VENTOLIN  HFA) 108 (90 Base) MCG/ACT inhaler, Inhale 2 puffs into the lungs every 6 (six) hours as needed for wheezing or shortness of breath., Disp: 18 g, Rfl: 2   atorvastatin (LIPITOR) 10 MG tablet, Take 1 tablet (10 mg total) by mouth at bedtime., Disp: 90 tablet, Rfl: 1   diclofenac (VOLTAREN) 50 MG EC tablet, Take 1 tablet (50  mg total) by mouth 2 (two) times daily., Disp: 60 tablet, Rfl: 2   escitalopram  (LEXAPRO ) 20 MG tablet, Take 1 tablet (20 mg total) by mouth at bedtime., Disp: 90 tablet, Rfl: 1   pantoprazole (PROTONIX) 40 MG tablet, Take 1 tablet (40 mg total) by mouth daily., Disp: 90 tablet, Rfl: 1   tamsulosin (FLOMAX) 0.4 MG CAPS capsule, Take 1 capsule (0.4 mg total) by mouth daily., Disp: 30 capsule, Rfl: 5   zinc oxide 20 % ointment, Apply 1 Application topically daily. To buttocks., Disp: , Rfl:    Review of Systems Review of Systems  Constitutional:  Negative for chills.  Eyes:  Negative for blurred vision and double vision.  Respiratory:  Positive for cough, sputum production and shortness of breath.   Cardiovascular:  Negative for chest pain, palpitations and leg swelling.  Gastrointestinal:  Positive for diarrhea and vomiting. Negative for abdominal pain, blood in stool, constipation and nausea.  Genitourinary:  Positive for dysuria. Negative for frequency and hematuria.  Musculoskeletal:  Negative for myalgias.  Skin:  Positive for rash.  Neurological:  Positive for headaches. Negative for dizziness and weakness.       Objective:    Vitals BP (!) 130/92   Pulse (!) 101   Temp 98.6 F (37 C) (Oral)   Resp 20   Ht 5\' 10"  (1.778 m)   Wt 204 lb (92.5 kg)   SpO2 95%   BMI 29.27 kg/m    Physical Examination Physical Exam Constitutional:      Appearance: Normal appearance. He is not ill-appearing.  Cardiovascular:     Rate and Rhythm: Normal rate and regular rhythm.     Pulses: Normal pulses.     Heart sounds: No murmur heard.    No friction rub. No gallop.  Pulmonary:     Effort: Pulmonary effort is normal. No respiratory distress.     Breath sounds: No wheezing, rhonchi or rales.  Abdominal:     General: Abdomen is flat. Bowel sounds are normal. There is no distension.     Palpations: Abdomen is soft.     Tenderness: There is no abdominal tenderness.  Musculoskeletal:      Right lower leg: No edema.     Left lower leg: No edema.  Skin:    General: Skin is warm and dry.     Findings: No rash.     Comments: Her has an open unstageable pressure ulcer to his sacrum located in the crease of his buttocks measuring 3cm x 0.5cm with sloughing material across the  base with some moisture related maceration.  Neurological:     General: No focal deficit present.     Mental Status: He is alert and oriented to person, place, and time.  Psychiatric:        Attention and Perception: Attention normal.        Mood and Affect: Mood is anxious.        Speech: Speech is rapid and pressured.        Behavior: Behavior is agitated and hyperactive.        Cognition and Memory: Cognition normal.        Judgment: Judgment normal.        Assessment & Plan:   Fever He states he is having fever at night but he is afebrile here.  I am going to obtain a CBC, CMP and C. Diff.  We will also check a UA as he states he is having some problems with urinating.  I will also obtain a repeat CXR.  GAD (generalized anxiety disorder) His anxiety is out of control.  I had a discussion with him to go see the urgent visit with his psychiatry in [facility] but he refuses.  His trazodone  is making him have restless leg and feelings of crawling skin.  He is to stop the trazodone  and we will decreased his wellbutrin  SR from 200mg  to 100mg  daily.  He is to continue on lexapro  20mg  daily and I will increase his clonazepam  from 0.5mg  BID prn to 1mg  BID prn.  He can use the clonazepam  for sleep.  He is not to take his pain medicine and clonazepam  together and this was discussed.  Panic disorder Plan as above.  Pressure injury of sacral region, unstageable [facility]) He has some maceration of his wound but he has no abscess on my exam.  There is no erythema around the wound or drainage today and I feel no fluctuance.  I want him to cover this wound and keep it dry and he is to place a dry dressing over this  until he is seen by wound care next week.  His pain is not controlled.  We will discontinue his hydrodocone and place him on a short course of oxycodone  prn.  Warnings discussed.  His [place] CSR/ PDMP was reviewed.  Diarrhea He is having diarrhea where immodium is not helping.  I am going to check a C diff on him at this time.    No follow-ups on file.   Wayne Haines, MD

## 2024-01-31 NOTE — ED Notes (Signed)
 Pt removed leads, BP cuff and pulse ox. Pt planning to leave AMA and states that he will give a stool sample in the morning.

## 2024-01-31 NOTE — ED Triage Notes (Signed)
 Pt states that he was referred to ED from PCP for high WBC. Pt reports having known ulcer to his sacral area and has been referred to wound care but has appointment next week.

## 2024-01-31 NOTE — Assessment & Plan Note (Signed)
 He has some maceration of his wound but he has no abscess on my exam.  There is no erythema around the wound or drainage today and I feel no fluctuance.  I want him to cover this wound and keep it dry and he is to place a dry dressing over this until he is seen by wound care next week.  His pain is not controlled.  We will discontinue his hydrodocone and place him on a short course of oxycodone  prn.  Warnings discussed.  His [place] CSR/ PDMP was reviewed.

## 2024-01-31 NOTE — Assessment & Plan Note (Signed)
 Plan as above.

## 2024-01-31 NOTE — Assessment & Plan Note (Signed)
 He is having diarrhea where immodium is not helping.  I am going to check a C diff on him at this time.

## 2024-01-31 NOTE — ED Provider Notes (Signed)
 [place] [facility] Provider Note   CSN: [id number] Arrival date & time: 01/31/24  1513     History  Chief Complaint  Patient presents with   Abnormal Lab    Robert Erickson is a 53 y.o. male presenting emergency department with complaint of diarrhea ongoing for several days or weeks.  Patient was seen by his doctor today and was referred into the ED for concern for high white blood cell count and ongoing diarrhea.  His doctor per my review of external records that ordered C. difficile and infectious stool studies but the patient has not yet been able to provide it.  It is not clear why his white blood cell count is a send not able to see those records.  Patient ports he has had subjective fevers and chills at home for the past few days.  He says he has diarrhea about 4 times a day, generally watery.  He was hospitalized most recently 1 month ago in April for shortness of breath and chest pain and anxiety, as a complication after having chest tube removal at [facility], developing pneumonia subsequently.  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  HPI     Home Medications Prior to Admission medications   Medication Sig Start Date End Date Taking? Authorizing Provider  doxycycline (VIBRAMYCIN) 100 MG capsule Take 1 capsule (100 mg total) by mouth 2 (two) times daily for 7 days. 01/31/24 02/07/24 Yes Ralph Brouwer, Janalyn Me, MD  leptospermum manuka honey (MEDIHONEY) gel Apply 1 Application topically daily for 21 days. 01/31/24 02/21/24 Yes Katrell Milhorn, Janalyn Me, MD  potassium chloride  (KLOR-CON ) 10 MEQ tablet Take 1 tablet (10 mEq total) by mouth daily. 01/31/24 03/01/24 Yes Trevante Tennell, Janalyn Me, MD  acetaminophen  (TYLENOL ) 650 MG CR tablet Take 650 mg by mouth every 6 (six) hours as needed for pain or fever.    [provider]  albuterol  (VENTOLIN  HFA) 108 (90 Base) MCG/ACT inhaler Inhale 2 puffs into the lungs every 6 (six) hours as needed for  wheezing or shortness of breath. 01/25/24   Wayne Haines, MD  atorvastatin (LIPITOR) 10 MG tablet Take 1 tablet (10 mg total) by mouth at bedtime. 01/31/24   Wayne Haines, MD  buPROPion  ER (WELLBUTRIN  SR) 100 MG 12 hr tablet Take 1 tablet (100 mg total) by mouth daily. 01/31/24   Wayne Haines, MD  clonazePAM  (KLONOPIN ) 1 MG tablet Take 1 tablet (1 mg total) by mouth 2 (two) times daily as needed for anxiety. 01/31/24   Wayne Haines, MD  diclofenac (VOLTAREN) 50 MG EC tablet Take 1 tablet (50 mg total) by mouth 2 (two) times daily. 01/31/24   Wayne Haines, MD  escitalopram  (LEXAPRO ) 20 MG tablet Take 1 tablet (20 mg total) by mouth at bedtime. 01/31/24   Wayne Haines, MD  oxyCODONE  (OXY IR/ROXICODONE ) 5 MG immediate release tablet Take 1 tablet (5 mg total) by mouth every 6 (six) hours as needed for severe pain (pain score 7-10). 01/31/24   Wayne Haines, MD  pantoprazole (PROTONIX) 40 MG tablet Take 1 tablet (40 mg total) by mouth daily. 01/31/24   Wayne Haines, MD  tamsulosin (FLOMAX) 0.4 MG CAPS capsule Take 1 capsule (0.4 mg total) by mouth daily. 01/31/24   Wayne Haines, MD  zinc oxide 20 % ointment Apply 1 Application topically daily. To buttocks.    [provider]      Allergies  Shellfish allergy, Shellfish-derived products, Aspirin, and Penicillin g    Review of Systems   Review of Systems  Physical Exam Updated Vital Signs BP (!) 136/100   Pulse 79   Temp 97.8 F (36.6 C)   Resp (!) 22   SpO2 93%  Physical Exam Constitutional:      General: He is not in acute distress.    Comments: Extremely anxious  HENT:     Head: Normocephalic and atraumatic.  Eyes:     Conjunctiva/sclera: Conjunctivae normal.     Pupils: Pupils are equal, round, and reactive to light.  Cardiovascular:     Rate and Rhythm: Normal rate and regular rhythm.  Pulmonary:     Effort: Pulmonary effort is normal. No respiratory distress.  Abdominal:     General: There is no distension.      Tenderness: There is no abdominal tenderness.  Skin:    General: Skin is warm and dry.     Comments: Stage 2 sacral decubitus ulceration, 1.5 cm in diameter, with mild surrounding erythema, no fluctuance or purulence  Neurological:     General: No focal deficit present.     Mental Status: He is alert. Mental status is at baseline.     ED Results / Procedures / Treatments   Labs (all labs ordered are listed, but only abnormal results are displayed) Labs Reviewed  COMPREHENSIVE METABOLIC PANEL WITH GFR - Abnormal; Notable for the following components:      Result Value   Potassium 3.2 (*)    CO2 20 (*)    Glucose, Bld 104 (*)    Calcium 7.9 (*)    Albumin 2.1 (*)    All other components within normal limits  CBC WITH DIFFERENTIAL/PLATELET - Abnormal; Notable for the following components:   WBC 12.5 (*)    RBC 4.14 (*)    Hemoglobin 12.6 (*)    HCT 38.9 (*)    Neutro Abs 10.4 (*)    All other components within normal limits  PROTIME-INR - Abnormal; Notable for the following components:   Prothrombin Time 15.5 (*)    All other components within normal limits  CULTURE, BLOOD (ROUTINE X 2)  CULTURE, BLOOD (ROUTINE X 2)  C DIFFICILE QUICK SCREEN W PCR REFLEX    GASTROINTESTINAL PANEL BY PCR, STOOL (REPLACES STOOL CULTURE)  URINALYSIS, W/ REFLEX TO CULTURE (INFECTION SUSPECTED)  I-STAT CG4 LACTIC ACID, ED    EKG EKG Interpretation Date/Time:  Thursday Jan 31 2024 18:15:40 EDT Ventricular Rate:  77 PR Interval:  144 QRS Duration:  84 QT Interval:  425 QTC Calculation: 481 R Axis:   82  Text Interpretation: Sinus rhythm Low voltage, precordial leads Borderline T abnormalities, anterior leads Borderline prolonged QT interval Confirmed by Jerald Molly [phone]) on 01/31/2024 10:18:00 PM  Radiology DG Chest 1 View Result Date: 01/31/2024 CLINICAL DATA:  Sepsis. EXAM: CHEST  1 VIEW COMPARISON:  Same day. FINDINGS: The heart size and mediastinal contours are within normal  limits. Reticular densities are noted throughout both lungs which may represent chronic interstitial lung disease. Minimal bibasilar subsegmental atelectasis or scarring is noted. The visualized skeletal structures are unremarkable. IMPRESSION: Bilateral lung opacities as noted above. Electronically Signed   By: Rosalene Colon M.D.   On: 01/31/2024 16:40   DG Pelvis 1-2 Views Result Date: 01/31/2024 CLINICAL DATA:  Possible sepsis.  Sacral ulcer. EXAM: PELVIS - 1-2 VIEW COMPARISON:  None Available. FINDINGS: There is no evidence of pelvic fracture or diastasis. No  pelvic bone lesions are seen. IMPRESSION: Negative. Electronically Signed   By: Rosalene Colon M.D.   On: 01/31/2024 16:37    Procedures Procedures    Medications Ordered in ED Medications - No data to display  ED Course/ Medical Decision Making/ A&P Clinical Course as of 01/31/24 2218  Thu Jan 31, 2024  2026 Patient cannot provide stool and is not willing to wait any longer for further testing.  He is extremely anxious and fidgeting.  He says he has oxygen at home for his COPD.  We discussed his subjective fevers and chills.  Do not see clear evidence of pneumonia as he specifically does not have coughing or chest pain or worsening shortness of breath, and I think the chest x-ray may be in mild over read for infiltrate.  I will give him a watch and wait prescription for doxycycline.  But ultimately I am concerned that additional unnecessary or empiric antibiotics may worsen his risk for C. difficile, which is a very real possibility.  He says he will provide the stool sample overnight and take it to his doctor's office tomorrow.  He is already been provided specimen cup and instructions by his doctor's office. [MT]    Clinical Course User Index [MT] Marguis Mathieson, Janalyn Me, MD                                 Medical Decision Making Risk Prescription drug management.   This patient presents to the ED with concern for diarrhea,  chills, subjective fevers. This involves an extensive number of treatment options, and is a complaint that carries with it a high risk of complications and morbidity.  The differential diagnosis includes infectious colitis versus urinary tract infection versus skin infection versus other infection  Co-morbidities that complicate the patient evaluation: History of decubitus ulcer and pneumonia   External records from outside source obtained and reviewed including most recent hospital discharge summary, PCP record from the office today  I ordered and personally interpreted labs.  The pertinent results include:  WBC 12.5, Hgb 12.6, K 3.2, Lactate wnl.  Blood cx drawn in triage and pending  I ordered imaging studies including x-ray of the pelvis and x-ray of the chest I independently visualized and interpreted imaging which showed questionable opacities of the lower lobes and single view chest x-ray I agree with the radiologist interpretation  The patient was maintained on a cardiac monitor.  I personally viewed and interpreted the cardiac monitored which showed an underlying rhythm of: Sinus rhythm  Per my interpretation the patient's ECG shows sinus rhythm no acute ischemic findings  I have reviewed the patients home medicines and have made adjustments as needed  Test Considered: Patient had no significant abdominal pain to warrant an emergent CT scan at this time.  Doubt acute pulmonary embolism or meningitis  Dispostion:  After consideration of the diagnostic results and the patients response to treatment, I feel that the patent would benefit from close outpatient follow up.         Final Clinical Impression(s) / ED Diagnoses Final diagnoses:  Diarrhea, unspecified type  Hypokalemia  Pressure injury of sacral region, stage 2 (HCC)    Rx / DC Orders ED Discharge Orders          Ordered    doxycycline (VIBRAMYCIN) 100 MG capsule  2 times daily        01/31/24 2025  leptospermum manuka honey (MEDIHONEY) gel  Daily        01/31/24 2025    potassium chloride  (KLOR-CON ) 10 MEQ tablet  Daily        01/31/24 2216              Arvilla Birmingham, MD 01/31/24 2218

## 2024-02-05 LAB — CULTURE, BLOOD (ROUTINE X 2)
Culture: NO GROWTH
Culture: NO GROWTH
Special Requests: ADEQUATE

## 2024-02-13 ENCOUNTER — Inpatient Hospital Stay: Admitting: Pulmonary Disease

## 2024-02-21 ENCOUNTER — Ambulatory Visit: Admitting: Internal Medicine

## 2024-02-22 ENCOUNTER — Ambulatory Visit: Admitting: Internal Medicine

## 2024-02-27 ENCOUNTER — Encounter: Payer: Self-pay | Admitting: Internal Medicine

## 2024-02-29 ENCOUNTER — Encounter: Payer: Self-pay | Admitting: Internal Medicine

## 2024-02-29 ENCOUNTER — Ambulatory Visit: Admitting: Internal Medicine

## 2024-02-29 VITALS — BP 154/98 | HR 98 | Temp 97.9°F | Resp 22 | Ht 70.0 in | Wt 194.4 lb

## 2024-02-29 DIAGNOSIS — M25571 Pain in right ankle and joints of right foot: Secondary | ICD-10-CM

## 2024-02-29 DIAGNOSIS — R0602 Shortness of breath: Secondary | ICD-10-CM | POA: Insufficient documentation

## 2024-02-29 DIAGNOSIS — F411 Generalized anxiety disorder: Secondary | ICD-10-CM

## 2024-02-29 HISTORY — DX: Shortness of breath: R06.02

## 2024-02-29 HISTORY — DX: Pain in right ankle and joints of right foot: M25.571

## 2024-02-29 NOTE — Progress Notes (Signed)
 Office Visit  Subjective   Patient ID: Robert Erickson   DOB: 09/10/1971   Age: 53 y.o.   MRN: [id number]   Chief Complaint Chief Complaint  Patient presents with   Follow-up    4 week f/u     History of Present Illness Robert Erickson is a 53 yo male who comes in today for a routine followup.  I saw him a month ago where he was having diarrhea and fevers.   This did resolve.  He has seen behavioral health twice since my last visit due to his OCD, ADD, and GAD with agoraphobia.  The patient had an unstageable pressure ulcer of his sacrum that was present on admission to [facility].  Wound Care followed him and this is slowly improved.  Today, he denies any drainage from this wound but he is still having some pain.  He is putting medihoney and zinc.  The patient states he is having pain that is moderate from his wound.  His pain has improved since his last visit.  He also saw Dr. Arita Belch last week who put him on spirva.  They are arranging for a nebulizer from [facility].  They are trying to arrange him to get a CT scan of chest.  The patient states he is having a lot cough and has had some scant blood when he coughs.  The patient sometimes that he has green sputum when he coughs.  He remains on 2L of oxygen via [place].  He states he gets more SOB when he gets a panic attack which he gets about 2 per day.  He states he is having pain in his chest from coughing.  Again, he saw behavioral health on 02/08/2024 and 02/28/2024 where his psychiatrist noted he was not sleeping well.  They started weaning him off celexa and started him on a trial of cymbalta.  They also started him on seroquel at night.  He just got the seroquel yesterday.  His anxiety has worsened since he has come out of the hospital.  He states that the prn clonazepam  has not been helping.  He is currently on wellbutrin  SR 200mg  daily, lexapro  20mg  daily, trazodone  100mg  at bedtime and clonazepam  0.5mg  po BID prn.  He tells me that  he cannot sleep at night and the trazodone  is causing him to have worsening shaking and making his skin crawl/restless leg.  He has stopped his adderal.  He is having panic attacks maybe twice a day.  He has been taking his clonazepam  BID prn where he takes it twice a day.  Psychiatry was consulted during his stay at [facility] where they discontinued his Seroquel and placed him on Zyprexa and changed to clonazepam  to lorazepam  and continued his other psych medications.    He states he had a fall 3 days when he was getting out of his bathtub.  This was a slip and he fell.  He has has bruising to his left innerarm, both knees and he rolled his right foot.  Today, he has swelling of his right foot with severe pain.  He is having pain in his knees as well.        Past Medical History Past Medical History:  Diagnosis Date   Anxiety    Arthritis    Asthma    Depression    Elevated cholesterol    Hypertension    Kidney stone    Obesity    Sleep apnea    does have  a cpap machine     Allergies Allergies  Allergen Reactions   Shellfish Allergy Anaphylaxis   Shellfish-Derived Products Anaphylaxis   Aspirin Rash   Penicillin G Rash     Medications  Current Outpatient Medications:    citalopram (CELEXA) 20 MG tablet, :take  1/2 a day for a week, then stop it, Disp: , Rfl:    DULoxetine (CYMBALTA) 30 MG capsule, Take 1 cap daily, Disp: , Rfl:    QUEtiapine (SEROQUEL) 25 MG tablet, Take 1 tab at night for 2 nights then 2 tabs at night, Disp: , Rfl:    SPIRIVA RESPIMAT 1.25 MCG/ACT AERS, SMARTSIG:2 inhalation Via Inhaler Daily, Disp: , Rfl:    acetaminophen  (TYLENOL ) 650 MG CR tablet, Take 650 mg by mouth every 6 (six) hours as needed for pain or fever., Disp: , Rfl:    albuterol  (VENTOLIN  HFA) 108 (90 Base) MCG/ACT inhaler, Inhale 2 puffs into the lungs every 6 (six) hours as needed for wheezing or shortness of breath., Disp: 18 g, Rfl: 2   atorvastatin  (LIPITOR) 10 MG tablet, Take 1 tablet  (10 mg total) by mouth at bedtime., Disp: 90 tablet, Rfl: 1   buPROPion  ER (WELLBUTRIN  SR) 100 MG 12 hr tablet, Take 1 tablet (100 mg total) by mouth daily., Disp: 30 tablet, Rfl: 2   clonazePAM  (KLONOPIN ) 1 MG tablet, Take 1 tablet (1 mg total) by mouth 2 (two) times daily as needed for anxiety., Disp: 60 tablet, Rfl: 1   diclofenac  (VOLTAREN ) 50 MG EC tablet, Take 1 tablet (50 mg total) by mouth 2 (two) times daily., Disp: 60 tablet, Rfl: 2   escitalopram  (LEXAPRO ) 20 MG tablet, Take 1 tablet (20 mg total) by mouth at bedtime., Disp: 90 tablet, Rfl: 1   oxyCODONE  (OXY IR/ROXICODONE ) 5 MG immediate release tablet, Take 1 tablet (5 mg total) by mouth every 6 (six) hours as needed for severe pain (pain score 7-10)., Disp: 20 tablet, Rfl: 0   pantoprazole  (PROTONIX ) 40 MG tablet, Take 1 tablet (40 mg total) by mouth daily., Disp: 90 tablet, Rfl: 1   potassium chloride  (KLOR-CON ) 10 MEQ tablet, Take 1 tablet (10 mEq total) by mouth daily., Disp: 30 tablet, Rfl: 0   tamsulosin  (FLOMAX ) 0.4 MG CAPS capsule, Take 1 capsule (0.4 mg total) by mouth daily., Disp: 90 capsule, Rfl: 1   zinc oxide 20 % ointment, Apply 1 Application topically daily. To buttocks., Disp: , Rfl:    Review of Systems Review of Systems  Constitutional:  Negative for chills and fever.  Respiratory:  Positive for cough, hemoptysis, sputum production and shortness of breath. Negative for wheezing.   Cardiovascular:  Negative for chest pain, palpitations and leg swelling.  Gastrointestinal:  Negative for abdominal pain, constipation, diarrhea, nausea and vomiting.  Musculoskeletal:  Negative for myalgias.  Skin:  Negative for itching and rash.  Neurological:  Negative for dizziness, weakness and headaches.       Objective:    Vitals BP (!) 154/98   Pulse 98   Temp 97.9 F (36.6 C) (Temporal)   Resp (!) 22   Ht 5' 10 (1.778 m)   Wt 194 lb 6.4 oz (88.2 kg)   SpO2 95% Comment: 2L 02  BMI 27.89 kg/m    Physical  Examination Physical Exam Constitutional:      Appearance: Normal appearance. He is not ill-appearing.   Cardiovascular:     Rate and Rhythm: Normal rate and regular rhythm.     Pulses: Normal pulses.  Heart sounds: No murmur heard.    No friction rub. No gallop.  Pulmonary:     Effort: Pulmonary effort is normal. No respiratory distress.     Breath sounds: No wheezing, rhonchi or rales.  Abdominal:     General: Abdomen is flat. Bowel sounds are normal. There is no distension.     Palpations: Abdomen is soft.     Tenderness: There is no abdominal tenderness.   Musculoskeletal:     Right lower leg: No edema.     Left lower leg: No edema.     Comments: He right foot is swelled and he has a lot of swelling over his lateral malleolus.   Skin:    General: Skin is warm and dry.     Findings: No rash.   Neurological:     Mental Status: He is alert.        Assessment & Plan:   GAD (generalized anxiety disorder) He has had his medications changed and he is going back in 2 weeks for a telehealth.  He has severe anxiety at this time and is crying in the room and having a panic attack.  He is not listening to me when I tell him that he needs to go to the ER for his foot and evaluate his breathing.  He denies any SI at this time.  I told him to followup with his psychiatrist.  Acute right ankle pain I think he has broken his right ankle.  He needs to go urgently to the ER.  I told him that he has to have this looked at and directed him to the ER.  SOB (shortness of breath) I want the ER to evaluate his breating.  He states he has had some hemoptysis.    No follow-ups on file.   Wayne Haines, MD

## 2024-02-29 NOTE — Assessment & Plan Note (Signed)
 I think he has broken his right ankle.  He needs to go urgently to the ER.  I told him that he has to have this looked at and directed him to the ER.

## 2024-02-29 NOTE — Assessment & Plan Note (Signed)
 He has had his medications changed and he is going back in 2 weeks for a telehealth.  He has severe anxiety at this time and is crying in the room and having a panic attack.  He is not listening to me when I tell him that he needs to go to the ER for his foot and evaluate his breathing.  He denies any SI at this time.  I told him to followup with his psychiatrist.

## 2024-02-29 NOTE — Assessment & Plan Note (Signed)
 I want the ER to evaluate his breating.  He states he has had some hemoptysis.

## 2024-03-30 ENCOUNTER — Other Ambulatory Visit: Payer: Self-pay | Admitting: Internal Medicine

## 2024-04-01 ENCOUNTER — Inpatient Hospital Stay: Admitting: Internal Medicine

## 2024-04-16 ENCOUNTER — Inpatient Hospital Stay: Admitting: Internal Medicine

## 2024-04-23 ENCOUNTER — Encounter: Payer: Self-pay | Admitting: Internal Medicine

## 2024-04-23 ENCOUNTER — Ambulatory Visit: Admitting: Internal Medicine

## 2024-04-23 VITALS — BP 140/100 | HR 105 | Temp 98.3°F | Resp 18 | Ht 70.0 in | Wt 204.2 lb

## 2024-04-23 DIAGNOSIS — I10 Essential (primary) hypertension: Secondary | ICD-10-CM | POA: Diagnosis not present

## 2024-04-23 DIAGNOSIS — F411 Generalized anxiety disorder: Secondary | ICD-10-CM

## 2024-04-23 MED ORDER — NEBIVOLOL HCL 5 MG PO TABS: 5.0000 mg | ORAL_TABLET | Freq: Every day | ORAL | 2 refills | Status: DC

## 2024-04-23 NOTE — Progress Notes (Signed)
 Office Visit  Subjective   Patient ID: Tysen Roesler   DOB: 27-Mar-1971   Age: 53 y.o.   MRN: [id number]   Chief Complaint Chief Complaint  Patient presents with   Follow-up    Hospital     History of Present Illness Mr. Anding is a 53 yo male who returns today for a hospital follow where he was admitted to [facility] from 04/15/2024 until 04/18/2024 due to concerns for withdrawal from multiple medications including klonopin .  He states he ran out of some of his medications and lost his klonopin , adderall and seroquel and had not taken it for days before his admission.  He had an appointment with psychiatry the day after admission but today he tells me that he had established care with a psychiatrist via telehealth.  They admitted him with agitation with possible benzo withdrawal with hypertensive urgency as well as depresion and anxiety.  They felt they he had pneumonia as well as sepsis where he was treated for acute on chronic hypoxic respiraotyr failure.  He was continue on IV antigiotics   He did improve during his hospitalization and he was discharged on amlodipine 10mg  daily, folic acid 1mg  daily and levaquin.  He finished his levaquin and today he denies any fevers, chills, cough, SOB or other problems.  They continued him on albuterol  HFA, atorvastatin  10mg , symbicort , Bupropion  SR 100mg  daily, clonazepam  1mg  BID prn, diclofenac  50mg  BID, duloxetine 60mg  daily, protonix  40mg  daily, seroquel 50mg  at bedtime, flomax  0.4mg  daily, spiriva 2 puffs daily  I saw him in 02/2024 and he had seen behavioral health on 02/08/2024 and 02/28/2024 where his psychiatrist noted he was not sleeping well.  They started weaning him off celexa and started him on a trial of cymbalta.  They also started him on seroquel at night.  He just got the seroquel yesterday.  His anxiety has worsened since he has come out of the hospital.  He states that the prn clonazepam  has not been helping.  He is currently on wellbutrin  SR 200mg   daily, lexapro  20mg  daily, trazodone  100mg  at bedtime and clonazepam  0.5mg  po BID prn.  He tells me that he cannot sleep at night and the trazodone  is causing him to have worsening shaking and making his skin crawl/restless leg.  He has stopped his adderal.  He is having panic attacks maybe twice a day.  He has been taking his clonazepam  BID prn where he takes it twice a day.  Psychiatry was consulted during his stay at [facility] where they discontinued his Seroquel and placed him on Zyprexa and changed to clonazepam  to lorazepam  and continued his other psych medications.  Since then, he has seen psychiatry where they above changes were made in his meds.  They did not change his psych medicaitons in the hospital.  Today, he states his anxeity is doing extremely better and is mild.  He states his depression is mild as well.    The patient returns for follow up of his hypertension.  He was diagnosed with HTN years ago.  He was on cozaar in the past but was taken off this.  They noted in the hospital that his BP was running in the 180's and his diastolic BP was in the 100's  He was started on amlodipine 10mg  daily.  He is tolerating this well.  He denies any headaches, blurred vision, double vision, dizziness, chest pain, palpitations, SOB, generalized weakness or edema.     Past Medical History Past Medical History:  Diagnosis Date   Anxiety    Arthritis    Asthma    Depression    Elevated cholesterol    Hypertension    Kidney stone    Obesity    Sleep apnea    does have a cpap machine     Allergies Allergies  Allergen Reactions   Shellfish Allergy Anaphylaxis   Shellfish-Derived Products Anaphylaxis   Aspirin Rash   Penicillin G Rash     Medications  Current Outpatient Medications:    acetaminophen  (TYLENOL ) 650 MG CR tablet, Take 650 mg by mouth every 6 (six) hours as needed for pain or fever., Disp: , Rfl:    albuterol  (VENTOLIN  HFA) 108 (90 Base) MCG/ACT inhaler, Inhale 2 puffs  into the lungs every 6 (six) hours as needed for wheezing or shortness of breath., Disp: 18 g, Rfl: 2   amLODipine (NORVASC) 10 MG tablet, Take 10 mg by mouth daily., Disp: , Rfl:    atorvastatin  (LIPITOR) 10 MG tablet, Take 1 tablet (10 mg total) by mouth at bedtime., Disp: 90 tablet, Rfl: 1   buPROPion  ER (WELLBUTRIN  SR) 100 MG 12 hr tablet, Take 1 tablet (100 mg total) by mouth daily., Disp: 30 tablet, Rfl: 2   citalopram (CELEXA) 20 MG tablet, :take  1/2 a day for a week, then stop it, Disp: , Rfl:    clonazePAM  (KLONOPIN ) 1 MG tablet, Take 1 tablet (1 mg total) by mouth 2 (two) times daily as needed for anxiety., Disp: 60 tablet, Rfl: 1   diclofenac  (VOLTAREN ) 50 MG EC tablet, Take 1 tablet (50 mg total) by mouth 2 (two) times daily., Disp: 60 tablet, Rfl: 2   DULoxetine (CYMBALTA) 30 MG capsule, Take 1 cap daily, Disp: , Rfl:    escitalopram  (LEXAPRO ) 20 MG tablet, Take 1 tablet (20 mg total) by mouth at bedtime., Disp: 90 tablet, Rfl: 1   folic acid (FOLVITE) 1 MG tablet, Take 1 mg by mouth daily., Disp: , Rfl:    pantoprazole  (PROTONIX ) 40 MG tablet, Take 1 tablet (40 mg total) by mouth daily., Disp: 90 tablet, Rfl: 1   potassium chloride  (KLOR-CON ) 10 MEQ tablet, Take 1 tablet (10 mEq total) by mouth daily., Disp: 30 tablet, Rfl: 0   QUEtiapine (SEROQUEL) 25 MG tablet, Take 1 tab at night for 2 nights then 2 tabs at night, Disp: , Rfl:    SPIRIVA RESPIMAT 1.25 MCG/ACT AERS, SMARTSIG:2 inhalation Via Inhaler Daily, Disp: , Rfl:    SYMBICORT  160-4.5 MCG/ACT inhaler, INHALE 2 PUFFS INTO THE LUNGS TWICE A DAY, Disp: 10.2 each, Rfl: 1   tamsulosin  (FLOMAX ) 0.4 MG CAPS capsule, Take 1 capsule (0.4 mg total) by mouth daily., Disp: 90 capsule, Rfl: 1   zinc oxide 20 % ointment, Apply 1 Application topically daily. To buttocks., Disp: , Rfl:    oxyCODONE  (OXY IR/ROXICODONE ) 5 MG immediate release tablet, Take 1 tablet (5 mg total) by mouth every 6 (six) hours as needed for severe pain (pain score  7-10). (Patient not taking: Reported on 04/23/2024), Disp: 20 tablet, Rfl: 0   Review of Systems Review of Systems  Constitutional:  Negative for chills and fever.  Eyes:  Negative for blurred vision.  Respiratory:  Negative for cough, sputum production and shortness of breath.   Cardiovascular:  Negative for chest pain, palpitations and leg swelling.  Gastrointestinal:  Negative for abdominal pain, constipation, diarrhea, nausea and vomiting.  Musculoskeletal:  Negative for myalgias.  Neurological:  Negative for dizziness, weakness and headaches.  Psychiatric/Behavioral:  Negative for substance abuse and suicidal ideas.        Objective:    Vitals BP (!) 140/100 (BP Location: Left Arm, Patient Position: Sitting, Cuff Size: Normal)   Pulse (!) 105   Temp 98.3 F (36.8 C)   Resp 18   Ht 5' 10 (1.778 m)   Wt 204 lb 4 oz (92.6 kg)   SpO2 95%   BMI 29.31 kg/m    Physical Examination Physical Exam Constitutional:      Appearance: Normal appearance. He is not ill-appearing.  Cardiovascular:     Rate and Rhythm: Normal rate and regular rhythm.     Pulses: Normal pulses.     Heart sounds: No murmur heard.    No friction rub. No gallop.  Pulmonary:     Effort: Pulmonary effort is normal. No respiratory distress.     Breath sounds: No wheezing, rhonchi or rales.  Abdominal:     General: Abdomen is flat. Bowel sounds are normal. There is no distension.     Palpations: Abdomen is soft.     Tenderness: There is no abdominal tenderness.  Musculoskeletal:     Right lower leg: No edema.     Left lower leg: No edema.  Skin:    General: Skin is warm and dry.     Findings: No rash.  Neurological:     Mental Status: He is alert.  Psychiatric:        Mood and Affect: Mood normal.        Behavior: Behavior normal.        Assessment & Plan:   Essential hypertension His BP and HR are still a bit elevated. I want him to continue on amlodipine and we will add bystolic  5mg  daily to  him.  He can come back in 2 weeks for a nursing BP check.  GAD (generalized anxiety disorder) His anxiety seems to be doing a lot better.  He is not tearful and anxious the way he was on his prior visits.  He will continue to followup with psychiatry.    No follow-ups on file.   Selinda Fleeta Finger, MD

## 2024-04-23 NOTE — Assessment & Plan Note (Signed)
 His anxiety seems to be doing a lot better.  He is not tearful and anxious the way he was on his prior visits.  He will continue to followup with psychiatry.

## 2024-04-23 NOTE — Assessment & Plan Note (Signed)
 His BP and HR are still a bit elevated. I want him to continue on amlodipine and we will add bystolic  5mg  daily to him.  He can come back in 2 weeks for a nursing BP check.

## 2024-04-30 ENCOUNTER — Other Ambulatory Visit: Payer: Self-pay

## 2024-04-30 NOTE — Progress Notes (Signed)
 Home Health called and checked the patients vitals, and reported the BP was 138/92 and HR was 76.

## 2024-05-07 ENCOUNTER — Telehealth: Payer: Self-pay

## 2024-05-07 ENCOUNTER — Ambulatory Visit

## 2024-05-07 NOTE — Telephone Encounter (Signed)
 Telephone call

## 2024-05-13 ENCOUNTER — Other Ambulatory Visit: Payer: Self-pay

## 2024-05-13 MED ORDER — FOLIC ACID 1 MG PO TABS: 1.0000 mg | ORAL_TABLET | Freq: Every day | ORAL | 0 refills | Status: DC

## 2024-05-13 MED ORDER — AMLODIPINE BESYLATE 10 MG PO TABS: 10.0000 mg | ORAL_TABLET | Freq: Every day | ORAL | 0 refills | Status: DC

## 2024-05-13 NOTE — Progress Notes (Signed)
 Rx refill

## 2024-05-15 ENCOUNTER — Other Ambulatory Visit: Payer: Self-pay | Admitting: Internal Medicine

## 2024-05-21 ENCOUNTER — Other Ambulatory Visit: Payer: Self-pay | Admitting: Internal Medicine

## 2024-06-07 ENCOUNTER — Other Ambulatory Visit: Payer: Self-pay | Admitting: Internal Medicine

## 2024-06-13 ENCOUNTER — Other Ambulatory Visit: Payer: Self-pay | Admitting: Internal Medicine

## 2024-06-17 ENCOUNTER — Other Ambulatory Visit: Payer: Self-pay | Admitting: Internal Medicine

## 2024-06-20 ENCOUNTER — Other Ambulatory Visit: Payer: Self-pay | Admitting: Internal Medicine

## 2024-07-23 ENCOUNTER — Ambulatory Visit: Admitting: Internal Medicine

## 2024-07-28 ENCOUNTER — Ambulatory Visit: Admitting: Internal Medicine

## 2024-08-20 ENCOUNTER — Other Ambulatory Visit: Payer: Self-pay | Admitting: Internal Medicine

## 2024-08-21 ENCOUNTER — Other Ambulatory Visit: Payer: Self-pay | Admitting: Internal Medicine

## 2024-10-21 ENCOUNTER — Other Ambulatory Visit: Payer: Self-pay

## 2024-10-21 DIAGNOSIS — N200 Calculus of kidney: Secondary | ICD-10-CM | POA: Insufficient documentation

## 2024-10-21 DIAGNOSIS — R7989 Other specified abnormal findings of blood chemistry: Secondary | ICD-10-CM | POA: Insufficient documentation

## 2024-10-21 DIAGNOSIS — J45909 Unspecified asthma, uncomplicated: Secondary | ICD-10-CM | POA: Insufficient documentation

## 2024-10-21 DIAGNOSIS — E7800 Pure hypercholesterolemia, unspecified: Secondary | ICD-10-CM | POA: Insufficient documentation

## 2024-10-21 DIAGNOSIS — I10 Essential (primary) hypertension: Secondary | ICD-10-CM | POA: Insufficient documentation

## 2024-10-21 DIAGNOSIS — G4733 Obstructive sleep apnea (adult) (pediatric): Secondary | ICD-10-CM | POA: Insufficient documentation

## 2024-10-21 DIAGNOSIS — M1990 Unspecified osteoarthritis, unspecified site: Secondary | ICD-10-CM | POA: Insufficient documentation

## 2024-10-21 DIAGNOSIS — Z6834 Body mass index (BMI) 34.0-34.9, adult: Secondary | ICD-10-CM | POA: Insufficient documentation

## 2024-10-21 DIAGNOSIS — E669 Obesity, unspecified: Secondary | ICD-10-CM | POA: Insufficient documentation

## 2024-10-21 DIAGNOSIS — F419 Anxiety disorder, unspecified: Secondary | ICD-10-CM | POA: Insufficient documentation

## 2024-10-23 ENCOUNTER — Encounter: Payer: Self-pay | Admitting: Cardiology

## 2024-10-23 ENCOUNTER — Ambulatory Visit: Admitting: Cardiology

## 2024-10-23 VITALS — BP 126/82 | HR 78 | Ht 69.0 in | Wt 232.6 lb

## 2024-10-23 DIAGNOSIS — F17210 Nicotine dependence, cigarettes, uncomplicated: Secondary | ICD-10-CM | POA: Diagnosis not present

## 2024-10-23 DIAGNOSIS — E7800 Pure hypercholesterolemia, unspecified: Secondary | ICD-10-CM

## 2024-10-23 DIAGNOSIS — I2510 Atherosclerotic heart disease of native coronary artery without angina pectoris: Secondary | ICD-10-CM | POA: Diagnosis not present

## 2024-10-23 DIAGNOSIS — G4733 Obstructive sleep apnea (adult) (pediatric): Secondary | ICD-10-CM | POA: Diagnosis not present

## 2024-10-23 DIAGNOSIS — J431 Panlobular emphysema: Secondary | ICD-10-CM

## 2024-10-23 DIAGNOSIS — I10 Essential (primary) hypertension: Secondary | ICD-10-CM | POA: Diagnosis not present

## 2024-10-23 DIAGNOSIS — I209 Angina pectoris, unspecified: Secondary | ICD-10-CM | POA: Diagnosis not present

## 2024-10-23 MED ORDER — NITROGLYCERIN 0.4 MG SL SUBL: 0.4000 mg | SUBLINGUAL_TABLET | SUBLINGUAL | 6 refills | Status: AC | PRN

## 2024-10-23 MED ORDER — PREDNISONE 50 MG PO TABS: ORAL_TABLET | ORAL | 0 refills | Status: AC

## 2024-10-23 MED ORDER — ASPIRIN 81 MG PO TBEC: 81.0000 mg | DELAYED_RELEASE_TABLET | Freq: Every day | ORAL | Status: AC

## 2024-10-23 NOTE — Patient Instructions (Signed)
 Medication Instructions:  Your physician has recommended you make the following change in your medication:   Take 81 mg coated aspirin  daily. Use nitroglycerin  as needed for chest pain.  *If you need a refill on your cardiac medications before your next appointment, please call your pharmacy*   Lab Work: Your physician recommends that you have a BMET and CBC today in the office for your upcoming procedure.  If you have labs (blood work) drawn today and your tests are completely normal, you will receive your results only by: MyChart Message (if you have MyChart) OR A paper copy in the mail If you have any lab test that is abnormal or we need to change your treatment, we will call you to review the results.   Testing/Procedures:  [facility] [place] [place] [address] [place] [address] Dept: [phone] Loc: [phone]  Robert Erickson  10/23/2024  You are scheduled for a Cardiac Catheterization on Thursday, February 12 with Dr. Peter Jordan.  1. Please arrive at the [facility] (Main Entrance A) at [facility] [address] [place], [place] [address] at 5:30 AM (This time is 2 hour(s) before your procedure to ensure your preparation).   Free valet parking service is available. You will check in at ADMITTING. The support person will be asked to wait in the waiting room.  It is OK to have someone drop you off and come back when you are ready to be discharged.    Special note: Every effort is made to have your procedure done on time. Please understand that emergencies sometimes delay scheduled procedures.  2. Diet: NPO: Nothing to eat OR drink after midnight. (For TEE and Cath the same day)   3. Hydration: You need to be well hydrated before your procedure. On February 12, you may drink approved liquids (see below) until 2 hours before the procedure, with 16 oz of water as your last  intake.   List of approved liquids water, clear juice, clear tea, black coffee, fruit juices, non-citric and without pulp, carbonated beverages, Gatorade, Kool -Aid, plain Jello-O and plain ice popsicles.  4. Labs: You will need to have blood drawn on Thursday, February 5 at [facility]: [address], [place] . You do not need to be fasting.  5. Medication instructions in preparation for your procedure:   Contrast Allergy: Yes, Please take Prednisone  50mg  by mouth at: Thirteen hours prior to cath 6:00pm on Wednesday Seven hours prior to cath 12:00am on Thursday And prior to leaving home please take last dose of Prednisone  50mg  and Benadryl  50mg  by mouth.   Stop taking, Advil or Motrin (Ibuprofen), Voltaren  or Cataflam or Arthrotec (Diclofenac ) Wednesday, February 11,  On the morning of your procedure, take your Aspirin  81 mg and any morning medicines NOT listed above.  You may use sips of water.  6. Plan to go home the same day, you will only stay overnight if medically necessary. 7. Bring a current list of your medications and current insurance cards. 8. You MUST have a responsible person to drive you home. 9. Someone MUST be with you the first 24 hours after you arrive home or your discharge will be delayed. 10. Please wear clothes that are easy to get on and off and wear slip-on shoes.  Thank you for allowing us  to care for you!   -- [facility]    Follow-Up:  At [facility], you and your health needs are our priority.  As part of our continuing mission to provide you with exceptional heart care, we have created designated Provider Care Teams.  These Care Teams include your primary Cardiologist (physician) and Advanced Practice Providers (APPs -  Physician Assistants and Nurse Practitioners) who all work together to provide you with the care you need, when you need it.  We recommend signing up for the patient portal called MyChart.  Sign  up information is provided on this After Visit Summary.  MyChart is used to connect with patients for Virtual Visits (Telemedicine).  Patients are able to view lab/test results, encounter notes, upcoming appointments, etc.  Non-urgent messages can be sent to your provider as well.   To learn more about what you can do with MyChart, go to [email or web address].    Your next appointment:   After cath  The format for your next appointment:   In Person  Provider:   Jennifer Crape, MD   Other Instructions  Coronary Angiogram With Stent Coronary angiogram with stent placement is a procedure to widen or open a narrow blood vessel of the heart (coronary artery). Arteries may become blocked by cholesterol buildup (plaques) in the lining of the artery wall. When a coronary artery becomes partially blocked, blood flow to that area decreases. This may lead to chest pain or a heart attack (myocardial infarction). A stent is a small piece of metal that looks like mesh or spring. Stent placement may be done as treatment after a heart attack, or to prevent a heart attack if a blocked artery is found by a coronary angiogram. Let your health care provider know about: Any allergies you have, including allergies to medicines or contrast dye. All medicines you are taking, including vitamins, herbs, eye drops, creams, and over-the-counter medicines. Any problems you or family members have had with anesthetic medicines. Any blood disorders you have. Any surgeries you have had. Any medical conditions you have, including kidney problems or kidney failure. Whether you are pregnant or may be pregnant. Whether you are breastfeeding. What are the risks? Generally, this is a safe procedure. However, serious problems may occur, including: Damage to nearby structures or organs, such as the heart, blood vessels, or kidneys. A return of blockage. Bleeding, infection, or bruising at the insertion site. A collection  of blood under the skin (hematoma) at the insertion site. A blood clot in another part of the body. Allergic reaction to medicines or dyes. Bleeding into the abdomen (retroperitoneal bleeding). Stroke (rare). Heart attack (rare). What happens before the procedure? Staying hydrated Follow instructions from your health care provider about hydration, which may include: Up to 2 hours before the procedure - you may continue to drink clear liquids, such as water, clear fruit juice, black coffee, and plain tea.    Eating and drinking restrictions Follow instructions from your health care provider about eating and drinking, which may include: 8 hours before the procedure - stop eating heavy meals or foods, such as meat, fried foods, or fatty foods. 6 hours before the procedure - stop eating light meals or foods, such as toast or cereal. 2 hours before the procedure - stop drinking clear liquids. Medicines Ask your health care provider about: Changing or stopping your regular medicines. This is especially important if you are taking diabetes medicines or blood thinners. Taking medicines such as aspirin  and ibuprofen. These medicines can thin your blood. Do not take these medicines unless your health care  provider tells you to take them. Generally, aspirin  is recommended before a thin tube, called a catheter, is passed through a blood vessel and inserted into the heart (cardiac catheterization). Taking over-the-counter medicines, vitamins, herbs, and supplements. General instructions Do not use any products that contain nicotine or tobacco for at least 4 weeks before the procedure. These products include cigarettes, e-cigarettes, and chewing tobacco. If you need help quitting, ask your health care provider. Plan to have someone take you home from the hospital or clinic. If you will be going home right after the procedure, plan to have someone with you for 24 hours. You may have tests and imaging  procedures. Ask your health care provider: How your insertion site will be marked. Ask which artery will be used for the procedure. What steps will be taken to help prevent infection. These may include: Removing hair at the insertion site. Washing skin with a germ-killing soap. Taking antibiotic medicine. What happens during the procedure? An IV will be inserted into one of your veins. Electrodes may be placed on your chest to monitor your heart rate during the procedure. You will be given one or more of the following: A medicine to help you relax (sedative). A medicine to numb the area (local anesthetic) for catheter insertion. A small incision will be made for catheter insertion. The catheter will be inserted into an artery using a guide wire. The location may be in your groin, your wrist, or the fold of your arm (near your elbow). An X-ray procedure (fluoroscopy) will be used to help guide the catheter to the opening of the heart arteries. A dye will be injected into the catheter. X-rays will be taken. The dye helps to show where any narrowing or blockages are located in the arteries. Tell your health care provider if you have chest pain or trouble breathing. A tiny wire will be guided to the blocked spot, and a balloon will be inflated to make the artery wider. The stent will be expanded to crush the plaques into the wall of the vessel. The stent will hold the area open and improve the blood flow. Most stents have a drug coating to reduce the risk of the stent narrowing over time. The artery may be made wider using a drill, laser, or other tools that remove plaques. The catheter will be removed when the blood flow improves. The stent will stay where it was placed, and the lining of the artery will grow over it. A bandage (dressing) will be placed on the insertion site. Pressure will be applied to stop bleeding. The IV will be removed. This procedure may vary among health care providers  and hospitals.    What happens after the procedure? Your blood pressure, heart rate, breathing rate, and blood oxygen level will be monitored until you leave the hospital or clinic. If the procedure is done through the leg, you will lie flat in bed for a few hours or for as long as told by your health care provider. You will be instructed not to bend or cross your legs. The insertion site and the pulse in your foot or wrist will be checked often. You may have more blood tests, X-rays, and a test that records the electrical activity of your heart (electrocardiogram, or ECG). Do not drive for 24 hours if you were given a sedative during your procedure. Summary Coronary angiogram with stent placement is a procedure to widen or open a narrowed coronary artery. This is done to  treat heart problems. Before the procedure, let your health care provider know about all the medical conditions and surgeries you have or have had. This is a safe procedure. However, some problems may occur, including damage to nearby structures or organs, bleeding, blood clots, or allergies. Follow your health care provider's instructions about eating, drinking, medicines, and other lifestyle changes, such as quitting tobacco use before the procedure. This information is not intended to replace advice given to you by your health care provider. Make sure you discuss any questions you have with your health care provider. Document Revised: 03/26/2019 Document Reviewed: 03/26/2019 Elsevier Patient Education  2021 Elsevier Inc.  Aspirin  and Your Heart Aspirin  is a medicine that prevents the platelets in your blood from sticking together. Platelets are the cells that your blood uses for clotting. Aspirin  can be used to help reduce the risk of blood clots, heart attacks, and other heart-related problems. What are the risks? Daily use of aspirin  can cause side effects. Some of these include: Bleeding. Bleeding can be minor or  serious. An example of minor bleeding is bleeding from a cut, and the bleeding does not stop. An example of more serious bleeding is stomach bleeding or, rarely, bleeding into the brain. Your risk of bleeding increases if you are also taking NSAIDs, such as ibuprofen. Increased bruising. Upset stomach. An allergic reaction. People who have growths inside the nose (nasal polyps) have an increased risk of developing an aspirin  allergy. How to use aspirin  to care for your heart Take aspirin  only as told by your health care provider. Make sure that you understand how much to take and what form to take. The two forms of aspirin  are: Non-enteric-coated.This type of aspirin  does not have a coating and is absorbed quickly. This type of aspirin  also comes in a chewable form. Enteric-coated. This type of aspirin  has a coating that releases the medicine very slowly. Enteric-coated aspirin  might cause less stomach upset than non-enteric-coated aspirin . This type of aspirin  should not be chewed or crushed. Work with your health care provider to find out whether it is safe and beneficial for you to take aspirin  daily. Taking aspirin  daily may be helpful if: You have had a heart attack or chest pain, or you are at risk for a heart attack. You have a condition in which certain heart vessels are blocked (coronary artery disease), and you have had a procedure to treat it. Examples are: Open-heart surgery, such as coronary artery bypass surgery (CABG). Coronary angioplasty,which is done to widen a blood vessel of your heart. Having a small mesh tube, or stent, placed in your coronary artery. You have had certain types of stroke or a mini-stroke known as a transient ischemic attack (TIA). You have a narrowing of the arteries that supply the limbs (peripheral artery disease, or PAD). You have long-term (chronic) heart rhythm problems, such as atrial fibrillation, and your health care provider thinks aspirin  may  help. You have valve disease or have had surgery on a valve. You are considered at increased risk of developing coronary artery disease or PAD.    Follow these instructions at home Medicines Take over-the-counter and prescription medicines only as told by your health care provider. If you are taking blood thinners: Talk with your health care provider before you take any medicines that contain aspirin  or NSAIDs, such as ibuprofen. These medicines increase your risk for dangerous bleeding. Take your medicine exactly as told, at the same time every day. Avoid activities that could  cause injury or bruising, and follow instructions about how to prevent falls. Wear a medical alert bracelet or carry a card that lists what medicines you take. General instructions Do not drink alcohol if: Your health care provider tells you not to drink. You are pregnant, may be pregnant, or are planning to become pregnant. If you drink alcohol: Limit how much you use to: 0-1 drink a day for women. 0-2 drinks a day for men. Be aware of how much alcohol is in your drink. In the [place] one drink equals one 12 oz bottle of beer (355 mL), one 5 oz glass of wine (148 mL), or one 1 oz glass of hard liquor (44 mL). Keep all follow-up visits as told by your health care provider. This is important. Where to find more information The American Heart Association: www.heart.org Contact a health care provider if you have: Unusual bleeding or bruising. Stomach pain or nausea. Ringing in your ears. An allergic reaction that causes hives, itchy skin, or swelling of the lips, tongue, or face. Get help right away if: You notice that your bowel movements are bloody, or dark red or black in color. You vomit or cough up blood. You have blood in your urine. You cough, breathe loudly (wheeze), or feel short of breath. You have chest pain, especially if the pain spreads to your arms, back, neck, or jaw. You have a headache with  confusion. You have any symptoms of a stroke. BE FAST is an easy way to remember the main warning signs of a stroke: B - Balance. Signs are dizziness, sudden trouble walking, or loss of balance. E - Eyes. Signs are trouble seeing or a sudden change in vision. F - Face. Signs are sudden weakness or numbness of the face, or the face or eyelid drooping on one side. A - Arms. Signs are weakness or numbness in an arm. This happens suddenly and usually on one side of the body. S - Speech. Signs are sudden trouble speaking, slurred speech, or trouble understanding what people say. T - Time. Time to call emergency services. Write down what time symptoms started. You have other signs of a stroke, such as: A sudden, severe headache with no known cause. Nausea or vomiting. Seizure. These symptoms may represent a serious problem that is an emergency. Do not wait to see if the symptoms will go away. Get medical help right away. Call your local emergency services (911 in the [place] Do not drive yourself to the hospital. Summary Aspirin  use can help reduce the risk of blood clots, heart attacks, and other heart-related problems. Daily use of aspirin  can cause side effects. Take aspirin  only as told by your health care provider. Make sure that you understand how much to take and what form to take. Your health care provider will help you determine whether it is safe and beneficial for you to take aspirin  daily. This information is not intended to replace advice given to you by your health care provider. Make sure you discuss any questions you have with your health care provider. Document Revised: 06/09/2019 Document Reviewed: 06/09/2019 Elsevier Patient Education  2021 Elsevier Inc. Nitroglycerin  sublingual tablets What is this medicine? NITROGLYCERIN  (nye troe GLI ser in) is a type of vasodilator. It relaxes blood vessels, increasing the blood and oxygen supply to your heart. This medicine is used to  relieve chest pain caused by angina. It is also used to prevent chest pain before activities like climbing stairs, going outdoors in cold  weather, or sexual activity. This medicine may be used for other purposes; ask your health care provider or pharmacist if you have questions. COMMON BRAND NAME(S): Nitroquick, Nitrostat , Nitrotab What should I tell my health care provider before I take this medicine? They need to know if you have any of these conditions: anemia head injury, recent stroke, or bleeding in the brain liver disease previous heart attack an unusual or allergic reaction to nitroglycerin , other medicines, foods, dyes, or preservatives pregnant or trying to get pregnant breast-feeding How should I use this medicine? Take this medicine by mouth as needed. Use at the first sign of an angina attack (chest pain or tightness). You can also take this medicine 5 to 10 minutes before an event likely to produce chest pain. Follow the directions exactly as written on the prescription label. Place one tablet under your tongue and let it dissolve. Do not swallow whole. Replace the dose if you accidentally swallow it. It will help if your mouth is not dry. Saliva around the tablet will help it to dissolve more quickly. Do not eat or drink, smoke or chew tobacco while a tablet is dissolving. Sit down when taking this medicine. In an angina attack, you should feel better within 5 minutes after your first dose. You can take a dose every 5 minutes up to a total of 3 doses. If you do not feel better or feel worse after 1 dose, call 9-1-1 at once. Do not take more than 3 doses in 15 minutes. Your health care provider might give you other directions. Follow those directions if he or she does. Do not take your medicine more often than directed. Talk to your health care provider about the use of this medicine in children. Special care may be needed. Overdosage: If you think you have taken too much of this  medicine contact a poison control center or emergency room at once. NOTE: This medicine is only for you. Do not share this medicine with others. What if I miss a dose? This does not apply. This medicine is only used as needed. What may interact with this medicine? Do not take this medicine with any of the following medications: certain migraine medicines like ergotamine and dihydroergotamine (DHE) medicines used to treat erectile dysfunction like sildenafil, tadalafil, and vardenafil riociguat This medicine may also interact with the following medications: alteplase aspirin  heparin medicines for high blood pressure medicines for mental depression other medicines used to treat angina phenothiazines like chlorpromazine, mesoridazine, prochlorperazine, thioridazine This list may not describe all possible interactions. Give your health care provider a list of all the medicines, herbs, non-prescription drugs, or dietary supplements you use. Also tell them if you smoke, drink alcohol, or use illegal drugs. Some items may interact with your medicine. What should I watch for while using this medicine? Tell your doctor or health care professional if you feel your medicine is no longer working. Keep this medicine with you at all times. Sit or lie down when you take your medicine to prevent falling if you feel dizzy or faint after using it. Try to remain calm. This will help you to feel better faster. If you feel dizzy, take several deep breaths and lie down with your feet propped up, or bend forward with your head resting between your knees. You may get drowsy or dizzy. Do not drive, use machinery, or do anything that needs mental alertness until you know how this drug affects you. Do not stand or sit up quickly,  especially if you are an older patient. This reduces the risk of dizzy or fainting spells. Alcohol can make you more drowsy and dizzy. Avoid alcoholic drinks. Do not treat yourself for coughs,  colds, or pain while you are taking this medicine without asking your doctor or health care professional for advice. Some ingredients may increase your blood pressure. What side effects may I notice from receiving this medicine? Side effects that you should report to your doctor or health care professional as soon as possible: allergic reactions (skin rash, itching or hives; swelling of the face, lips, or tongue) low blood pressure (dizziness; feeling faint or lightheaded, falls; unusually weak or tired) low red blood cell counts (trouble breathing; feeling faint; lightheaded, falls; unusually weak or tired) Side effects that usually do not require medical attention (report to your doctor or health care professional if they continue or are bothersome): facial flushing (redness) headache nausea, vomiting This list may not describe all possible side effects. Call your doctor for medical advice about side effects. You may report side effects to FDA at 1-800-FDA-1088. Where should I keep my medicine? Keep out of the reach of children. Store at room temperature between 20 and 25 degrees C (68 and 77 degrees F). Store in retail buyer. Protect from light and moisture. Keep tightly closed. Throw away any unused medicine after the expiration date. NOTE: This sheet is a summary. It may not cover all possible information. If you have questions about this medicine, talk to your doctor, pharmacist, or health care provider.  2021 Elsevier/Gold Standard (2018-06-05 16:46:32)

## 2024-10-23 NOTE — Progress Notes (Signed)
 " Cardiology Office Note:    Date:  10/23/2024   ID:  Robert Erickson, DOB 07-29-1971, MRN [id number]  PCP:  Cyrus Dixon, NP  Cardiologist:  Jennifer JONELLE Crape, MD   Referring MD: Cyrus Dixon, NP    ASSESSMENT:    1. Essential hypertension   2. Panlobular emphysema (HCC)   3. OSA (obstructive sleep apnea)   4. Elevated cholesterol   5. Angina pectoris   6. Cigarette smoker   7. Atherosclerosis of native coronary artery of native heart, unspecified whether angina present    PLAN:    In order of problems listed above:  Angina pectoris in a patient with documented coronary atherosclerosis: Patient's symptoms are very concerning and following recommendations were made to the patient.  He was advised to take a coated baby aspirin  on a daily basis.  Sublingual nitroglycerin  prescription was sent, its protocol and 911 protocol explained and the patient vocalized understanding questions were answered to the patient's satisfaction.In view of the patient's symptoms, I discussed with the patient options for evaluation. Invasive and noninvasive options were given to the patient. I discussed stress testing and coronary angiography and left heart catheterization at length. Benefits, pros and cons of each approach were discussed at length. Patient had multiple questions which were answered to the patient's satisfaction. Patient opted for invasive evaluation and we will set up for coronary angiography and left heart catheterization. Further recommendations will be made based on the findings with coronary angiography. In the interim if the patient has any significant symptoms in hospital to the nearest emergency room.  I also suggested CT coronary angiography but he is keen on conventional invasive coronary angiography.  Echo was done recently and unremarkable. Hypertension: Blood pressure is stable and diet was emphasized. Mixed dyslipidemia: On lipid-lowering medications followed by primary  care.  He has coronary atherosclerosis on CT scan so goal LDL must be less than 60. Cigarette smoking: I spent 5 minutes with the patient discussing solely about smoking. Smoking cessation was counseled. I suggested to the patient also different medications and pharmacological interventions. Patient is keen to try stopping on its own at this time. He will get back to me if he needs any further assistance in this matter. Will be seen in follow-up appointment after coronary angiography.   Medication Adjustments/Labs and Tests Ordered: Current medicines are reviewed at length with the patient today.  Concerns regarding medicines are outlined above.  Orders Placed This Encounter  Procedures   EKG 12-Lead   No orders of the defined types were placed in this encounter.    History of Present Illness:    Robert Erickson is a 54 y.o. male who is being seen today for the evaluation of chest pain at the request of Cyrus Dixon, NP.  Patient is a 54 year old gentleman.  He appears anxious today.  He has past medical history of essential hypertension and dyslipidemia and unfortunately continues to smoke.  He has history of COPD.  He mentions to me that he had pneumothorax and had tracheotomy and admission several months ago.  Now he complains of chest tightness on exertion.  It feels like somebody sitting on his chest and goes to the neck into the arms and he is concerned about it.  No orthopnea or PND.  No syncope.  At the time of my evaluation, the patient is alert awake oriented and in no distress.  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/01/2024   Chronic obstructive pulmonary disease (HCC) 01/25/2024   Depression    Diarrhea 01/31/2024   Elevated cholesterol     Essential hypertension 01/01/2024   Fever 01/31/2024   GAD (generalized anxiety disorder) 01/31/2024   Generalized anxiety disorder 01/01/2024   GERD (gastroesophageal reflux disease) 01/01/2024   History of pneumothorax 01/25/2024   Hypertension    IBS (irritable bowel syndrome) 01/01/2024   Kidney stone    Leukocytosis 01/01/2024   Low testosterone     Obesity    Obstructive sleep apnea 01/01/2024   OSA (obstructive sleep apnea)    Panic disorder 01/31/2024   Pneumothorax, left 01/01/2024   Pressure injury of sacral region, unstageable (HCC) 01/31/2024   Sacral wound 01/25/2024   SOB (shortness of breath) 02/29/2024   Tension pneumothorax 01/01/2024    Past Surgical History:  Procedure Laterality Date   CHOLECYSTECTOMY     COLONOSCOPY  01/17/2007   Diminuitive colonic polyp status post polypectomy. Minimal sigmoid diverticulosis. Incidental note of small diverticulum in the TI.   IR GUIDED DRAIN W CATHETER PLACEMENT  01/02/2024   TONSILLECTOMY      Current Medications: Active Medications[1]   Allergies:   Shellfish allergy, Shellfish protein-containing drug products, Aspirin , and Penicillin g   Social History   Socioeconomic History   Marital status: Married    Spouse name: Not on file   Number of children: Not on file   Years of education: Not on file   Highest education level: Not on file  Occupational History   Not on file  Tobacco Use   Smoking status: Former    Types: Cigarettes    Start date: 2025   Smokeless tobacco: Never  Vaping Use   Vaping status: Never Used  Substance and Sexual Activity   Alcohol use: Yes    Comment: occassionally    Drug use: Not Currently    Types: Marijuana   Sexual activity: Not on file  Other Topics Concern   Not on file  Social History Narrative   Not on file   Social Drivers of Health   Tobacco Use: Medium Risk (10/23/2024)   Patient History    Smoking Tobacco Use: Former    Smokeless Tobacco Use: Never     Passive Exposure: Not on Actuary Strain: Not on file  Food Insecurity: No Food Insecurity (01/02/2024)   Hunger Vital Sign    Worried About Running Out of Food in the Last Year: Never true    Ran Out of Food in the Last Year: Never true  Transportation Needs: No Transportation Needs (01/02/2024)   PRAPARE - Administrator, Civil Service (Medical): No    Lack of Transportation (Non-Medical): No  Physical Activity: Not on file  Stress: Not on file  Social Connections: Not on file  Depression (EYV7-0): Not on file  Alcohol Screen: Not on file  Housing: Low Risk (01/02/2024)   Housing Stability Vital Sign    Unable to Pay for Housing in the Last Year: No    Number of Times Moved in the Last Year: 1    Homeless in the Last Year: No  Utilities: Not At Risk (01/02/2024)   AHC Utilities    Threatened with loss of utilities: No  Health  Literacy: Not on file     Family History: The patient's family history includes Lung cancer in his maternal grandfather; Pancreatic cancer in his mother. There is no history of Colon cancer, Esophageal cancer, Stomach cancer, or Rectal cancer.  ROS:   Please see the history of present illness.    All other systems reviewed and are negative.  EKGs/Labs/Other Studies Reviewed:    The following studies were reviewed today: .SABRASABRAEKG Interpretation Date/Time:  Thursday October 23 2024 08:19:00 EST Ventricular Rate:  78 PR Interval:  146 QRS Duration:  84 QT Interval:  404 QTC Calculation: 460 R Axis:   56  Text Interpretation: Normal sinus rhythm Normal ECG When compared with ECG of 31-Jan-2024 18:15, PREVIOUS ECG IS PRESENT Confirmed by Edwyna Backers [phone]) on 10/23/2024 8:43:58 AM    EKG Interpretation Date/Time:  Thursday October 23 2024 08:19:00 EST Ventricular Rate:  78 PR Interval:  146 QRS Duration:  84 QT Interval:  404 QTC Calculation: 460 R Axis:   56  Text Interpretation: Normal sinus rhythm Normal ECG When  compared with ECG of 31-Jan-2024 18:15, PREVIOUS ECG IS PRESENT Confirmed by Edwyna Backers [phone]) on 10/23/2024 8:43:58 AM     Recent Labs: 01/31/2024: ALT 10; BUN 9; Creatinine, Ser 0.96; Hemoglobin 12.6; Platelets 398; Potassium 3.2; Sodium 138  Recent Lipid Panel No results found for: CHOL, TRIG, HDL, CHOLHDL, VLDL, LDLCALC, LDLDIRECT  Physical Exam:    VS:  BP 126/82   Pulse 78   Ht 5' 9 (1.753 m)   Wt 232 lb 9.6 oz (105.5 kg)   SpO2 94%   BMI 34.35 kg/m     Wt Readings from Last 3 Encounters:  10/23/24 232 lb 9.6 oz (105.5 kg)  04/23/24 204 lb 4 oz (92.6 kg)  02/29/24 194 lb 6.4 oz (88.2 kg)     GEN: Patient is in no acute distress HEENT: Normal NECK: No JVD; No carotid bruits LYMPHATICS: No lymphadenopathy CARDIAC: S1 S2 regular, 2/6 systolic murmur at the apex. RESPIRATORY:  Clear to auscultation without rales, wheezing or rhonchi  ABDOMEN: Soft, non-tender, non-distended MUSCULOSKELETAL:  No edema; No deformity  SKIN: Warm and dry NEUROLOGIC:  Alert and oriented x 3 PSYCHIATRIC:  Normal affect    Signed, Backers JONELLE Edwyna, MD  10/23/2024 8:46 AM    [facility]      [1]  Current Meds  Medication Sig   acetaminophen  (TYLENOL ) 650 MG CR tablet Take 650 mg by mouth every 6 (six) hours as needed for pain or fever.   amLODipine  (NORVASC ) 10 MG tablet TAKE 1 TABLET BY MOUTH EVERY DAY   amphetamine -dextroamphetamine  (ADDERALL) 10 MG tablet Take 10 mg by mouth 2 (two) times daily.   atorvastatin  (LIPITOR) 10 MG tablet Take 1 tablet (10 mg total) by mouth at bedtime.   azelastine (ASTELIN) 0.1 % nasal spray Place 1 spray into both nostrils 2 (two) times daily. Use in each nostril as directed   budesonide -glycopyrrolate-formoterol  (BREZTRI AEROSPHERE) 160-9-4.8 MCG/ACT AERO inhaler Inhale 2 puffs into the lungs daily.   buPROPion  ER (WELLBUTRIN  SR) 100 MG 12 hr tablet Take 1 tablet (100 mg total) by mouth daily.   Cholecalciferol  1.25 MG (50000 UT) capsule Take 50,000 Units by mouth once a week.   clonazePAM  (KLONOPIN ) 0.5 MG tablet Take 0.5 mg by mouth 2 (two) times daily.   diclofenac  (VOLTAREN ) 50 MG EC tablet TAKE 1 TABLET BY MOUTH TWICE A DAY   diclofenac  Sodium (VOLTAREN ) 1 % GEL  Apply 2 g topically 4 (four) times daily.   DULoxetine (CYMBALTA) 60 MG capsule Take 60 mg by mouth daily.   EPIPEN  2-PAK 0.3 MG/0.3ML SOAJ injection Inject 0.3 mg into the muscle as needed for anaphylaxis.   fluticasone (FLONASE) 50 MCG/ACT nasal spray Place 1 spray into both nostrils daily.   folic acid  (FOLVITE ) 1 MG tablet TAKE 1 TABLET BY MOUTH EVERY DAY   ibuprofen (ADVIL) 600 MG tablet Take 600 mg by mouth every 6 (six) hours as needed. for pain   mirtazapine (REMERON) 30 MG tablet Take 30 mg by mouth at bedtime.   nebivolol  (BYSTOLIC ) 5 MG tablet TAKE 1 TABLET (5 MG TOTAL) BY MOUTH DAILY.   pantoprazole  (PROTONIX ) 40 MG tablet Take 1 tablet (40 mg total) by mouth daily.   SPIRIVA RESPIMAT 1.25 MCG/ACT AERS SMARTSIG:2 inhalation Via Inhaler Daily   SYMBICORT  160-4.5 MCG/ACT inhaler INHALE 2 PUFFS INTO THE LUNGS TWICE A DAY   tamsulosin  (FLOMAX ) 0.4 MG CAPS capsule TAKE 1 CAPSULE BY MOUTH EVERY DAY   traZODone  (DESYREL ) 50 MG tablet Take 50-100 mg by mouth at bedtime.   Varenicline Tartrate, Starter, 0.5 MG X 11 & 1 MG X 42 TBPK Take 1 tablet by mouth 2 (two) times daily.   VENTOLIN  HFA 108 (90 Base) MCG/ACT inhaler TAKE 2 PUFFS BY MOUTH EVERY 6 HOURS AS NEEDED FOR WHEEZE OR SHORTNESS OF BREATH   [DISCONTINUED] escitalopram  (LEXAPRO ) 20 MG tablet Take 1 tablet (20 mg total) by mouth at bedtime.   "

## 2024-10-24 LAB — CBC
Hematocrit: 44.1 % (ref 37.5–51.0)
Hemoglobin: 14.9 g/dL (ref 13.0–17.7)
MCH: 32 pg (ref 26.6–33.0)
MCHC: 33.8 g/dL (ref 31.5–35.7)
MCV: 95 fL (ref 79–97)
Platelets: 269 10*3/uL (ref 150–450)
RBC: 4.66 x10E6/uL (ref 4.14–5.80)
RDW: 11.9 % (ref 11.6–15.4)
WBC: 9.2 10*3/uL (ref 3.4–10.8)

## 2024-10-24 LAB — BASIC METABOLIC PANEL WITH GFR
BUN/Creatinine Ratio: 9 (ref 9–20)
BUN: 10 mg/dL (ref 6–24)
CO2: 19 mmol/L — ABNORMAL LOW (ref 20–29)
Calcium: 9.2 mg/dL (ref 8.7–10.2)
Chloride: 107 mmol/L — ABNORMAL HIGH (ref 96–106)
Creatinine, Ser: 1.13 mg/dL (ref 0.76–1.27)
Glucose: 97 mg/dL (ref 70–99)
Potassium: 3.8 mmol/L (ref 3.5–5.2)
Sodium: 143 mmol/L (ref 134–144)
eGFR: 78 mL/min/{1.73_m2}

## 2024-10-30 ENCOUNTER — Encounter (HOSPITAL_COMMUNITY): Admission: RE | Payer: Self-pay | Source: Home / Self Care

## 2024-10-30 ENCOUNTER — Ambulatory Visit (HOSPITAL_COMMUNITY): Admission: RE | Admit: 2024-10-30 | Source: Home / Self Care | Admitting: Cardiology

## 2024-11-27 ENCOUNTER — Ambulatory Visit: Admitting: Cardiology
